# Patient Record
Sex: Female | Born: 1975
Health system: Southern US, Community
[De-identification: ages and names within clinical notes are randomized; demographics above are authoritative.]

## PROBLEM LIST (undated history)

## (undated) DIAGNOSIS — C801 Malignant (primary) neoplasm, unspecified: Secondary | ICD-10-CM

## (undated) DIAGNOSIS — N189 Chronic kidney disease, unspecified: Secondary | ICD-10-CM

## (undated) DIAGNOSIS — N390 Urinary tract infection, site not specified: Secondary | ICD-10-CM

## (undated) DIAGNOSIS — N3010 Interstitial cystitis (chronic) without hematuria: Secondary | ICD-10-CM

## (undated) DIAGNOSIS — N939 Abnormal uterine and vaginal bleeding, unspecified: Principal | ICD-10-CM

## (undated) DIAGNOSIS — R102 Pelvic and perineal pain: Secondary | ICD-10-CM

## (undated) DIAGNOSIS — Z87442 Personal history of urinary calculi: Secondary | ICD-10-CM

## (undated) DIAGNOSIS — M1990 Unspecified osteoarthritis, unspecified site: Secondary | ICD-10-CM

## (undated) DIAGNOSIS — Z9889 Other specified postprocedural states: Secondary | ICD-10-CM

## (undated) DIAGNOSIS — R112 Nausea with vomiting, unspecified: Secondary | ICD-10-CM

## (undated) DIAGNOSIS — R87619 Unspecified abnormal cytological findings in specimens from cervix uteri: Secondary | ICD-10-CM

## (undated) DIAGNOSIS — B009 Herpesviral infection, unspecified: Secondary | ICD-10-CM

## (undated) DIAGNOSIS — K219 Gastro-esophageal reflux disease without esophagitis: Secondary | ICD-10-CM

## (undated) DIAGNOSIS — R319 Hematuria, unspecified: Secondary | ICD-10-CM

## (undated) DIAGNOSIS — B9689 Other specified bacterial agents as the cause of diseases classified elsewhere: Principal | ICD-10-CM

## (undated) DIAGNOSIS — A599 Trichomoniasis, unspecified: Secondary | ICD-10-CM

## (undated) DIAGNOSIS — N760 Acute vaginitis: Principal | ICD-10-CM

## (undated) DIAGNOSIS — R350 Frequency of micturition: Secondary | ICD-10-CM

## (undated) DIAGNOSIS — D242 Benign neoplasm of left breast: Secondary | ICD-10-CM

## (undated) DIAGNOSIS — N898 Other specified noninflammatory disorders of vagina: Principal | ICD-10-CM

## (undated) HISTORY — DX: Other specified bacterial agents as the cause of diseases classified elsewhere: B96.89

## (undated) HISTORY — DX: Acute vaginitis: N76.0

## (undated) HISTORY — DX: Trichomoniasis, unspecified: A59.9

## (undated) HISTORY — DX: Unspecified abnormal cytological findings in specimens from cervix uteri: R87.619

## (undated) HISTORY — DX: Other specified noninflammatory disorders of vagina: N89.8

## (undated) HISTORY — DX: Benign neoplasm of left breast: D24.2

## (undated) HISTORY — DX: Interstitial cystitis (chronic) without hematuria: N30.10

## (undated) HISTORY — DX: Gastro-esophageal reflux disease without esophagitis: K21.9

## (undated) HISTORY — DX: Malignant (primary) neoplasm, unspecified: C80.1

## (undated) HISTORY — DX: Personal history of urinary calculi: Z87.442

## (undated) HISTORY — DX: Frequency of micturition: R35.0

## (undated) HISTORY — DX: Pelvic and perineal pain: R10.2

## (undated) HISTORY — DX: Urinary tract infection, site not specified: N39.0

## (undated) HISTORY — PX: TUBAL LIGATION: SHX77

## (undated) HISTORY — DX: Abnormal uterine and vaginal bleeding, unspecified: N93.9

## (undated) HISTORY — DX: Hematuria, unspecified: R31.9

## (undated) HISTORY — PX: TONSILLECTOMY AND ADENOIDECTOMY: SUR1326

## (undated) HISTORY — PX: CHOLECYSTECTOMY: SHX55

## (undated) HISTORY — DX: Herpesviral infection, unspecified: B00.9

---

## 2003-01-14 ENCOUNTER — Ambulatory Visit (HOSPITAL_COMMUNITY): Admission: RE | Admit: 2003-01-14 | Discharge: 2003-01-14 | Payer: Self-pay | Admitting: *Deleted

## 2003-03-11 ENCOUNTER — Ambulatory Visit (HOSPITAL_COMMUNITY): Admission: RE | Admit: 2003-03-11 | Discharge: 2003-03-11 | Payer: Self-pay | Admitting: Internal Medicine

## 2003-06-03 ENCOUNTER — Inpatient Hospital Stay (HOSPITAL_COMMUNITY): Admission: RE | Admit: 2003-06-03 | Discharge: 2003-06-05 | Payer: Self-pay | Admitting: *Deleted

## 2004-08-05 ENCOUNTER — Ambulatory Visit (HOSPITAL_COMMUNITY): Admission: RE | Admit: 2004-08-05 | Discharge: 2004-08-05 | Payer: Self-pay | Admitting: Pulmonary Disease

## 2005-03-22 ENCOUNTER — Emergency Department (HOSPITAL_COMMUNITY): Admission: EM | Admit: 2005-03-22 | Discharge: 2005-03-22 | Payer: Self-pay | Admitting: Emergency Medicine

## 2005-06-28 ENCOUNTER — Encounter (INDEPENDENT_AMBULATORY_CARE_PROVIDER_SITE_OTHER): Payer: Self-pay | Admitting: Internal Medicine

## 2005-06-28 ENCOUNTER — Ambulatory Visit: Payer: Self-pay | Admitting: Family Medicine

## 2005-07-12 ENCOUNTER — Ambulatory Visit: Payer: Self-pay | Admitting: Family Medicine

## 2005-08-03 ENCOUNTER — Ambulatory Visit: Payer: Self-pay | Admitting: Family Medicine

## 2005-08-05 ENCOUNTER — Ambulatory Visit: Payer: Self-pay | Admitting: Family Medicine

## 2005-08-31 ENCOUNTER — Ambulatory Visit: Payer: Self-pay | Admitting: Family Medicine

## 2005-08-31 ENCOUNTER — Encounter (INDEPENDENT_AMBULATORY_CARE_PROVIDER_SITE_OTHER): Payer: Self-pay | Admitting: Internal Medicine

## 2005-09-01 ENCOUNTER — Ambulatory Visit (HOSPITAL_COMMUNITY): Admission: RE | Admit: 2005-09-01 | Discharge: 2005-09-01 | Payer: Self-pay | Admitting: Family Medicine

## 2005-09-01 ENCOUNTER — Encounter (INDEPENDENT_AMBULATORY_CARE_PROVIDER_SITE_OTHER): Payer: Self-pay | Admitting: Internal Medicine

## 2005-10-25 ENCOUNTER — Encounter (INDEPENDENT_AMBULATORY_CARE_PROVIDER_SITE_OTHER): Payer: Self-pay | Admitting: Internal Medicine

## 2006-02-27 ENCOUNTER — Encounter: Payer: Self-pay | Admitting: Family Medicine

## 2006-02-27 DIAGNOSIS — K219 Gastro-esophageal reflux disease without esophagitis: Secondary | ICD-10-CM | POA: Insufficient documentation

## 2006-02-27 DIAGNOSIS — K5900 Constipation, unspecified: Secondary | ICD-10-CM | POA: Insufficient documentation

## 2006-02-27 DIAGNOSIS — L989 Disorder of the skin and subcutaneous tissue, unspecified: Secondary | ICD-10-CM | POA: Insufficient documentation

## 2006-04-13 ENCOUNTER — Telehealth (INDEPENDENT_AMBULATORY_CARE_PROVIDER_SITE_OTHER): Payer: Self-pay | Admitting: Family Medicine

## 2006-11-24 ENCOUNTER — Emergency Department (HOSPITAL_COMMUNITY): Admission: EM | Admit: 2006-11-24 | Discharge: 2006-11-25 | Payer: Self-pay | Admitting: *Deleted

## 2007-02-01 ENCOUNTER — Ambulatory Visit: Payer: Self-pay | Admitting: Orthopedic Surgery

## 2007-02-01 DIAGNOSIS — M5430 Sciatica, unspecified side: Secondary | ICD-10-CM | POA: Insufficient documentation

## 2007-03-13 ENCOUNTER — Telehealth: Payer: Self-pay | Admitting: Orthopedic Surgery

## 2007-03-14 ENCOUNTER — Encounter: Payer: Self-pay | Admitting: Orthopedic Surgery

## 2007-04-16 ENCOUNTER — Telehealth: Payer: Self-pay | Admitting: Orthopedic Surgery

## 2007-04-26 ENCOUNTER — Telehealth: Payer: Self-pay | Admitting: Orthopedic Surgery

## 2007-06-20 ENCOUNTER — Ambulatory Visit: Payer: Self-pay | Admitting: Internal Medicine

## 2007-06-20 LAB — CONVERTED CEMR LAB
Inflenza A Ag: NEGATIVE
Rapid Strep: NEGATIVE

## 2007-06-22 ENCOUNTER — Telehealth (INDEPENDENT_AMBULATORY_CARE_PROVIDER_SITE_OTHER): Payer: Self-pay | Admitting: Internal Medicine

## 2007-07-13 ENCOUNTER — Ambulatory Visit (HOSPITAL_COMMUNITY): Admission: RE | Admit: 2007-07-13 | Discharge: 2007-07-13 | Payer: Self-pay | Admitting: Occupational Medicine

## 2007-10-10 ENCOUNTER — Ambulatory Visit: Payer: Self-pay | Admitting: Internal Medicine

## 2007-10-10 DIAGNOSIS — J209 Acute bronchitis, unspecified: Secondary | ICD-10-CM | POA: Insufficient documentation

## 2007-10-15 ENCOUNTER — Telehealth (INDEPENDENT_AMBULATORY_CARE_PROVIDER_SITE_OTHER): Payer: Self-pay | Admitting: Internal Medicine

## 2007-11-22 ENCOUNTER — Other Ambulatory Visit: Admission: RE | Admit: 2007-11-22 | Discharge: 2007-11-22 | Payer: Self-pay | Admitting: Obstetrics and Gynecology

## 2008-02-08 ENCOUNTER — Ambulatory Visit: Payer: Self-pay | Admitting: Internal Medicine

## 2008-03-14 ENCOUNTER — Encounter (INDEPENDENT_AMBULATORY_CARE_PROVIDER_SITE_OTHER): Payer: Self-pay | Admitting: Internal Medicine

## 2008-05-30 ENCOUNTER — Ambulatory Visit: Payer: Self-pay | Admitting: Internal Medicine

## 2008-05-30 DIAGNOSIS — R300 Dysuria: Secondary | ICD-10-CM | POA: Insufficient documentation

## 2008-05-30 DIAGNOSIS — B373 Candidiasis of vulva and vagina: Secondary | ICD-10-CM | POA: Insufficient documentation

## 2008-05-30 LAB — CONVERTED CEMR LAB
Bilirubin Urine: NEGATIVE
Blood Glucose, AC Bkfst: 105 mg/dL
Glucose, Urine, Semiquant: NEGATIVE
Ketones, urine, test strip: NEGATIVE
Nitrite: NEGATIVE
Specific Gravity, Urine: 1.02
Urobilinogen, UA: 0.2
pH: 7

## 2008-06-03 LAB — CONVERTED CEMR LAB
BUN: 11 mg/dL (ref 6–23)
CO2: 22 meq/L (ref 19–32)
Calcium: 9.1 mg/dL (ref 8.4–10.5)
Chloride: 108 meq/L (ref 96–112)
Cholesterol: 166 mg/dL (ref 0–200)
Creatinine, Ser: 0.68 mg/dL (ref 0.40–1.20)
Glucose, Bld: 92 mg/dL (ref 70–99)
HDL: 56 mg/dL (ref >39)
LDL Cholesterol: 96 mg/dL (ref 0–99)
Potassium: 4.4 meq/L (ref 3.5–5.3)
Sodium: 140 meq/L (ref 135–145)
Total CHOL/HDL Ratio: 3
Triglycerides: 72 mg/dL (ref <150)
VLDL: 14 mg/dL (ref 0–40)

## 2008-07-09 ENCOUNTER — Ambulatory Visit: Payer: Self-pay | Admitting: Internal Medicine

## 2008-07-09 DIAGNOSIS — L255 Unspecified contact dermatitis due to plants, except food: Secondary | ICD-10-CM | POA: Insufficient documentation

## 2008-10-08 ENCOUNTER — Other Ambulatory Visit: Admission: RE | Admit: 2008-10-08 | Discharge: 2008-10-08 | Payer: Self-pay | Admitting: Obstetrics and Gynecology

## 2009-04-09 ENCOUNTER — Inpatient Hospital Stay (HOSPITAL_COMMUNITY): Admission: RE | Admit: 2009-04-09 | Discharge: 2009-04-12 | Payer: Self-pay | Admitting: Obstetrics and Gynecology

## 2009-04-13 ENCOUNTER — Encounter: Admission: RE | Admit: 2009-04-13 | Discharge: 2009-04-25 | Payer: Self-pay | Admitting: Obstetrics and Gynecology

## 2010-04-19 LAB — CBC
HCT: 31.6 % — ABNORMAL LOW (ref 36.0–46.0)
HCT: 38.8 % (ref 36.0–46.0)
Hemoglobin: 10.8 g/dL — ABNORMAL LOW (ref 12.0–15.0)
Hemoglobin: 12.9 g/dL (ref 12.0–15.0)
MCHC: 33.3 g/dL (ref 30.0–36.0)
MCHC: 34.2 g/dL (ref 30.0–36.0)
MCV: 93.1 fL (ref 78.0–100.0)
MCV: 93.2 fL (ref 78.0–100.0)
Platelets: 161 10*3/uL (ref 150–400)
Platelets: 182 10*3/uL (ref 150–400)
RBC: 3.39 MIL/uL — ABNORMAL LOW (ref 3.87–5.11)
RBC: 4.17 MIL/uL (ref 3.87–5.11)
RDW: 14.2 % (ref 11.5–15.5)
RDW: 14.2 % (ref 11.5–15.5)
WBC: 10.1 10*3/uL (ref 4.0–10.5)
WBC: 13.2 10*3/uL — ABNORMAL HIGH (ref 4.0–10.5)

## 2010-04-19 LAB — BASIC METABOLIC PANEL
BUN: 4 mg/dL — ABNORMAL LOW (ref 6–23)
CO2: 23 mEq/L (ref 19–32)
Calcium: 9 mg/dL (ref 8.4–10.5)
Chloride: 106 mEq/L (ref 96–112)
Creatinine, Ser: 0.42 mg/dL (ref 0.4–1.2)
GFR calc Af Amer: >60 mL/min (ref >60)
GFR calc non Af Amer: >60 mL/min (ref >60)
Glucose, Bld: 88 mg/dL (ref 70–99)
Potassium: 3.7 mEq/L (ref 3.5–5.1)
Sodium: 135 mEq/L (ref 135–145)

## 2010-04-19 LAB — TYPE AND SCREEN
ABO/RH(D): A POS
Antibody Screen: NEGATIVE

## 2010-04-19 LAB — HEMOGLOBIN A1C
Hgb A1c MFr Bld: 4.9 % (ref 4.6–6.1)
Mean Plasma Glucose: 94 mg/dL

## 2010-04-19 LAB — GLUCOSE, CAPILLARY
Glucose-Capillary: 102 mg/dL — ABNORMAL HIGH (ref 70–99)
Glucose-Capillary: 81 mg/dL (ref 70–99)
Glucose-Capillary: 92 mg/dL (ref 70–99)

## 2010-04-19 LAB — ABO/RH: ABO/RH(D): A POS

## 2010-04-19 LAB — RPR: RPR Ser Ql: NONREACTIVE

## 2010-06-11 NOTE — H&P (Signed)
NAME:  Marissa Trevino, MASCI                           ACCOUNT NO.:  [id number]   MEDICAL RECORD NO.:  [id number]                   PATIENT TYPE:  AMB   LOCATION:  DAY                                  FACILITY:  [facility]   PHYSICIAN:  Langley Gauss, M.D.                DATE OF BIRTH:  07-04-1975   DATE OF ADMISSION:  06/03/2003  DATE OF DISCHARGE:                                HISTORY & PHYSICAL   The patient is admitted for repeat low transverse Cesarean section. The  surgical procedure is scheduled at 7:30 a.m.   The patient is a 35 year old, gravida 2, para 1, at 38+ weeks gestation by  good dating criteria who is elective repeat Cesarean section. The patient's  prenatal course is complicated by findings of greater than 10 to the 5th  group B strep bacteruria on January 03, 2003. She was treated with  amoxicillin at that time. However, on retesting for carrier status on May 15, 2003, the patient was noted to test negative. The patient has had serial  ultrasounds which have documented normal anatomic survey and normal fetal  growth. She was noted to have normal glucose tolerance test during her  previous pregnancy. She has no high risk factors, thus is not repeated  during this pregnancy. She is noted to be A positive blood type. The AFP  triple screen is within normal limits. GC and Chlamydia cultures are  negative. Urine drug screen is also noted to be negative.   PAST MEDICAL HISTORY:  On July 18, 1995, the patient had a primary low  transverse Cesarean section performed on findings of breech presentation,  thus patient will proceed with elective repeat during this gestation.  She  underwent laparoscopic cholecystectomy in 1998. She states she is allergic  to CODEINE, bu does well with oxycodone and Hydrocodone.   SOCIAL HISTORY:  The patient is a nonsmoker. The father of this baby is  name, Fara Boros, who is employed as a Curator in [place].  The patient  would like to try  breast feeding. She will be utilizing pediatrician on call  and then [facility]. She would like to utilize birth control pills for postpartum  birth control options.  Currently, Dr. Danne Harbor and Dr. Dietrich Pates office was  notified, dated May 02, 2003, regarding the planned repeat Cesarean section  for Jun 03, 2003, 07:30 a.m., specifically Dois Davenport called our office and  spoke with Irving Burton.   PHYSICAL EXAMINATION:  GENERAL: A pleasant white female.  VITAL SIGNS: Height 5 feet 3 inches, prepregnancy weight 144 and most recent  217, blood pressure 117/78, pulse rate 80, respiratory rate 20.  HEENT: Negative, no adenopathy.  NECK: Supple. Thyroid not palpable.  LUNGS: Clear.  CARDIOVASCULAR: Regular rate and rhythm.  ABDOMEN: Soft and nontender. Vertex presentation by Leopold's maneuver.  Fetal heart tones auscultated in the 150s. Low transverse uterine incision  is noted.  EXTREMITIES:  Within normal limits.  PELVIC EXAM: Normal external genitalia. No lesions or ulcerations  identified. No vaginal bleeding and no leakage of fluid.   ASSESSMENT:  A 38+ week intrauterine pregnancy by good dating criteria. The  patient is admitted for repeat low transverse Cesarean section on the stated  date of service.     ___________________________________________                                         Langley Gauss, M.D.   DC/MEDQ  D:  06/02/2003  T:  06/03/2003  Job:  [id number]

## 2010-06-11 NOTE — Op Note (Signed)
NAME:  Marissa Trevino, Marissa Trevino                           ACCOUNT NO.:  [id number]   MEDICAL RECORD NO.:  [id number]                   PATIENT TYPE:  INP   LOCATION:  A402                                 FACILITY:  [facility]   PHYSICIAN:  Langley Gauss, M.D.                DATE OF BIRTH:  August 08, 1975   DATE OF PROCEDURE:  06/03/2003  DATE OF DISCHARGE:                                 OPERATIVE REPORT   PREOPERATIVE DIAGNOSES:  1. Thirty-eight-week-plus intrauterine pregnancy.  2. previous low transverse cesarean section performed a breech presentation.   OPERATION/PROCEDURE:  Repeat low vertical cesarean section, delivery of a 9  pound 11 ounce, macrosomic female infant, through a Pfannenstiel skin  incision and low transverse uterine incision.   SURGEON:  Langley Gauss, M.D.   ESTIMATED BLOOD LOSS:  800 mL.   SPECIMENS:  1. Arterial cord gas and cord blood to laboratory.  2. Placenta likewise is sent to pathology laboratory as there is noted to be     a true knot in the umbilical cord.   PEDIATRICIAN:  __________.   DRAINS:  Jackson-Pratt catheter is placed within the subcutaneous space.  In  additional a Foley catheter is draining clear yellow urine from the bladder.   DESCRIPTION OF PROCEDURE:  The patient had planned a repeat cesarean section  on Jun 03, 2003.  The patient presented early this a.m. at which time a non-  stress test was performed on the fourth floor obstetrical unit.  The patient  is then taken to the operating room where her vital signs are stable.  The  patient had a spinal analgesic administered by the OR staff.  She is then  placed on the OR stable with a slight left lateral tilt, prepped and draped  in the usual sterile manner.  Foley catheter sterilely placed by the OR  staff.  After assurance that adequate surgical analgesia, a knife is used to  incise a Pfannenstiel incision at the previous Pfannenstiel site, dissected  down to the fascia utilizing the knife and  cauterizing bleeders along the  way.  The fascia is then incised in a transverse curvilinear manner.  Utilizing sharp dissection with a knife, followed by transection of the  fascia, utilizing the Mayo scissors while bluntly separated off the  underlying rectus muscle.  The fascia edges is grasped with straight Kocher  clamps, fascia is separated off the underlying rectus muscle both superiorly  and inferiorly in the midline utilizing sharp dissection with the Mayo  scissors.  The rectus muscles are stitched together in the middle.  A knife  was used to incise down through this rectus muscle site until the peritoneum  is encountered.  The rectus muscles are separated in the midline utilizing  sharp dissection with the Mayo scissors.  Peritoneum is identified.  Peritoneum is elevated with hemostats.  Mayo scissors used to atraumatically  enter the peritoneal cavity.  Peritoneal incision is then extended  superiorly and inferiorly.  Inferiorly we directly visualized and palpated  the bladder to avoid any accidental injury.  A bladder blade is then placed.  Peritoneal incision extended superiorly up to the level near the umbilicus.  A bladder flap is then created from vesicouterine fold at the left  transverse uterine segment.  This is then bluntly separated off the anterior  low transverse uterine segment.  A sharp knife is then used to score a low  transverse uterine incision.  Intact amniotic sac is encountered in the  midline.  My index fingers are used to bluntly extend the uterine incision  bilaterally.  Intact amniotic sac is then ruptured with an Allis clamp.  Clear amniotic fluid is noted.  My index fingers are then used to bluntly  extend the low transverse uterine incision.  The head of the infant is then  flexed and elevated.  The disposable suction connected to the wall suction  is then applied to the infant's vertex.  Gentle traction and suction is then  applied.  With gentle  traction and fundal pressure, the infant's vertex  delivered very easily through the low transverse uterine incision.  Mouth  and nares are bulb-suctioned of clear amniotic fluid.  Suction cup is then  removed.  Again fundal pressure plus gentle traction results in delivery of  the remainder of the infant with the right shoulder in an anterior position.  Delivery itself is performed without complications.  A spontaneous cry is  noted. The umbilical cord is milked towards the infant.  Cord is doubly  clamped and cut and infant is taken to the waiting pediatrician, Dr.  __________.  The true knot in the umbilical cord is noted.  It is noted to  have had no significance during the pregnancy course.  Arterial cord gas and  cord blood are then obtained.  Gentle traction combined with intrauterine  exploration results in removal of what appears to be an intact placenta.  The uterus is then exteriorized.  The uterine excision is noted to have not  extended.  Intrauterine exploration reveals no retained placenta fragments.  The uterine incision is then closed in two layers of 0 chromic in a running  locked fascia, the second layer being an imbricating layer.  There was small  hematoma just subserosally near the left apex of the incision which requires  several figure-of-eight sutures to control and completely obliterate.  The  bladder flap is reapproximated utilizing continuous running 0 chromic  suture.  This assures hemostasis.  Tubes and ovaries are noted to be normal  in appearance as is the uterus itself.  Cul-de-sac is then irrigated free of  all clots.  Uterus returned to the pelvic cavity.  Sponge, needle and  instrument counts are correct x2 at this point. Noted at this time is some  small amount of active bleeding occurring at the peritoneal edge of the  bladder flap near the left apex of the incision.  This is then controlled  utilizing a figure-of-eight suture of 0 chromic to affect  hemostasis. Sponge, needle and instrument counts are correct x2 again at this point.  Peritoneal edges are grasped using Kelly clamps.  Copious irrigation is then  performed until clear.  The peritoneum is then closed with a continuous  running 0 chromic suture.  This likewise reapproximates the overlying rectus  muscle in the midline.  No bleeders are noted associated with the rectus  muscle.  Thus, the  fascia is then closed with a 0 PDS suture continuous  running suture.  Subcutaneous bleeders are then cauterized.  A Jackson-Pratt  drain is placed in the subcutaneous space with a separate exit wound to the  right apex of the incision.  Jackson-Pratt drain is sutured into place.  Three horizontal mattress sutures of 0 PDS are then placed to function as  retention-type sutures to facilitate our skin stapling.  The entirety of the  skin incisions are closed utilizing skin staples.  A total of 30 mL 0.5%  bupivacaine plain is then injected along the entirety of the skin incision  to raise small skin wheals.  The patient tolerated the procedure very well.  She continues to drain clear yellow urine.  Vital signs remain stable.  She  is thus taken to the recovery room in stable condition.  Will now go to the  fourth floor and discuss operative findings with the patient's waiting  family.      ___________________________________________                                            Langley Gauss, M.D.   DC/MEDQ  D:  06/03/2003  T:  06/03/2003  Job:  [id number]

## 2010-06-11 NOTE — Discharge Summary (Signed)
NAME:  Marissa Trevino, Marissa Trevino                           ACCOUNT NO.:  [id number]   MEDICAL RECORD NO.:  [id number]                   PATIENT TYPE:  INP   LOCATION:  A402                                 FACILITY:  [facility]   PHYSICIAN:  Langley Gauss, M.D.                DATE OF BIRTH:  12/03/1975   DATE OF ADMISSION:  06/03/2003  DATE OF DISCHARGE:  06/05/2003                                 DISCHARGE SUMMARY   DIAGNOSES:  1. 39-week intrauterine pregnancy, previous cesarean section.  2. Surgery performed Jun 03, 2003; repeat low transverse cesarean section.   DISPOSITION:  At the time of discharge staples are left in place in the  Pfannenstiel incision.  She is advised to call or contact the office for  staple removal in five days time.  She is given a copy of standardized  discharge instructions.   DISCHARGE MEDICATIONS:  She is given prescription for Tylox #30 with no  refill.   The patient is both bottle and breast feeding at time of discharge.   PERTINENT LABORATORY STUDIES:  Admission hemoglobin hematocrit 12.1/35.8  with white count 8.2.  Post partum day #1 hemoglobin 9.8, hematocrit 29.5  with white count of 10.8.  This represents anemia secondary to blood loss  associated with delivery.  The patient at time of visit would likely be  interested in utilizing oral contraceptives for birth control purposes.   HOSPITAL COURSE:  See previous dictation.  See operative procedure performed  on Jun 03, 2003 as a scheduled repeat cesarean section.  Spinal analgesic  administered by anesthesia staff and worked very well.  Postoperatively, the  patient did well.  She had no postoperative complications.  After removal of  the Foley catheter on postoperative day #1, she was ambulatory and able to  void without difficulty.  She remained afebrile.  Advanced her diet on  postoperative day #2.  The patient is doing very well with p.o. Tylox for  pain relief.  She is tolerating regular general diet.   Has begun passing  flatus, but bowel movement has not yet occurred.   On examination, abdomen is noted to be soft and nondistended.  The incision  is well approximated.     ___________________________________________                                         Langley Gauss, M.D.   DC/MEDQ  D:  06/05/2003  T:  06/05/2003  Job:  [id number]

## 2010-11-03 LAB — URINALYSIS, ROUTINE W REFLEX MICROSCOPIC
Glucose, UA: NEGATIVE
Leukocytes, UA: NEGATIVE
Nitrite: NEGATIVE
Specific Gravity, Urine: >1.03 — ABNORMAL HIGH
Urobilinogen, UA: 0.2
pH: 5.5

## 2010-11-03 LAB — BASIC METABOLIC PANEL
BUN: 11
CO2: 25
Calcium: 9.1
Chloride: 104
Creatinine, Ser: 0.49
GFR calc Af Amer: >60
GFR calc non Af Amer: >60
Glucose, Bld: 124 — ABNORMAL HIGH
Potassium: 3.9
Sodium: 135

## 2010-11-03 LAB — URINE MICROSCOPIC-ADD ON

## 2010-11-03 LAB — PREGNANCY, URINE: Preg Test, Ur: NEGATIVE

## 2010-12-14 ENCOUNTER — Other Ambulatory Visit (HOSPITAL_COMMUNITY)
Admission: RE | Admit: 2010-12-14 | Discharge: 2010-12-14 | Disposition: A | Discharge status: Home / Self Care | Payer: Self-pay | Source: Ambulatory Visit | Attending: Obstetrics and Gynecology | Admitting: Obstetrics and Gynecology

## 2010-12-14 DIAGNOSIS — Z01419 Encounter for gynecological examination (general) (routine) without abnormal findings: Secondary | ICD-10-CM | POA: Insufficient documentation

## 2011-02-18 ENCOUNTER — Emergency Department (HOSPITAL_COMMUNITY)
Admission: EM | Admit: 2011-02-18 | Discharge: 2011-02-18 | Disposition: A | Discharge status: Home / Self Care | Payer: 59 | Source: Home / Self Care | Attending: Emergency Medicine | Admitting: Emergency Medicine

## 2011-02-18 ENCOUNTER — Encounter (HOSPITAL_COMMUNITY): Payer: Self-pay

## 2011-02-18 DIAGNOSIS — B9789 Other viral agents as the cause of diseases classified elsewhere: Secondary | ICD-10-CM

## 2011-02-18 DIAGNOSIS — J029 Acute pharyngitis, unspecified: Secondary | ICD-10-CM

## 2011-02-18 DIAGNOSIS — B349 Viral infection, unspecified: Secondary | ICD-10-CM

## 2011-02-18 LAB — POCT RAPID STREP A: Streptococcus, Group A Screen (Direct): NEGATIVE

## 2011-02-18 MED ORDER — HYDROCODONE-ACETAMINOPHEN 5-325 MG PO TABS: 2.0000 | ORAL_TABLET | ORAL | Status: AC | PRN

## 2011-02-18 MED ORDER — LIDOCAINE VISCOUS 2 % MT SOLN: 10.0000 mL | Freq: Three times a day (TID) | OROMUCOSAL | Status: AC | PRN

## 2011-02-18 MED ORDER — IBUPROFEN 600 MG PO TABS: 600.0000 mg | ORAL_TABLET | Freq: Four times a day (QID) | ORAL | Status: AC | PRN

## 2011-02-18 NOTE — ED Provider Notes (Signed)
History     CSN: [id number]  Arrival date & time 02/18/11  1103   First MD Initiated Contact with Patient 02/18/11 1155      Chief Complaint  Patient presents with  . Sore Throat    (Consider location/radiation/quality/duration/timing/severity/associated sxs/prior treatment) HPI Comments: Patient with sore throat, body aches, fatigue over the past 4 or 5 days. Reports nonproductive cough and rhinorrhea.  States she had a fever to approximately 103 last night. Was unable to sleep secondary to coughing and body aches. Has been taking DayQuil, NyQuil, Tylenol 1 g with moderate relief. No sinus pain/pressure, nausea, vomiting, wheezing, chest pain, shortness of breath, urinary complaints. Patient works in a nursing home, and her children also have a viral illness  ROS as noted in HPI. All other ROS negative. .  Patient is a 36 y.o. female presenting with pharyngitis. The history is provided by the patient.  Sore Throat This is a new problem. The current episode started more than 2 days ago. The problem occurs constantly.    History reviewed. No pertinent past medical history.  Past Surgical History  Procedure Date  . Tubal ligation   . Cesarean section   . Tonsillectomy and adenoidectomy     History reviewed. No pertinent family history.  History  Substance Use Topics  . Smoking status: Never Smoker   . Smokeless tobacco: Not on file  . Alcohol Use: No    OB History    Grav Para Term Preterm Abortions TAB SAB Ect Mult Living                  Review of Systems  Allergies  Codeine  Home Medications   Current Outpatient Rx  Name Route Sig Dispense Refill  . ACETAMINOPHEN 500 MG PO TABS Oral Take 500 mg by mouth every 6 (six) hours as needed.    Marland Kitchen HYDROCODONE-ACETAMINOPHEN 5-325 MG PO TABS Oral Take 2 tablets by mouth every 4 (four) hours as needed for pain. 20 tablet 0  . IBUPROFEN 600 MG PO TABS Oral Take 1 tablet (600 mg total) by mouth every 6 (six) hours as  needed for pain. 30 tablet 0  . LIDOCAINE VISCOUS 2 % MT SOLN Oral Take 10 mLs by mouth 3 (three) times daily as needed for pain. Swish and spit. Do not swallow. 100 mL 0    BP 120/68  Pulse 118  Temp(Src) 100.4 F (38 C) (Oral)  Resp 16  SpO2 100%  LMP 01/28/2011  Physical Exam  Nursing note and vitals reviewed. Constitutional: She is oriented to person, place, and time. She appears well-developed and well-nourished.  HENT:  Head: Normocephalic and atraumatic. No trismus in the jaw.  Right Ear: Tympanic membrane and ear canal normal.  Left Ear: Tympanic membrane and ear canal normal.  Nose: Mucosal edema and rhinorrhea present. No epistaxis.  Mouth/Throat: Uvula is midline and mucous membranes are normal. No uvula swelling. Posterior oropharyngeal erythema present. No oropharyngeal exudate.       (-) frontal, maxillary sinus tenderness. Tonsils surgically absent. Ulcers on hard palate.  Eyes: Conjunctivae and EOM are normal.  Neck: Normal range of motion. Neck supple.  Cardiovascular: Regular rhythm, normal heart sounds and intact distal pulses.   Pulmonary/Chest: Effort normal and breath sounds normal.  Abdominal: Soft. She exhibits no distension. There is no splenomegaly. There is no tenderness. There is no rebound, no guarding and no CVA tenderness.  Musculoskeletal: Normal range of motion.  Lymphadenopathy:    She has  cervical adenopathy.  Neurological: She is alert and oriented to person, place, and time.  Skin: Skin is warm and dry. No rash noted.  Psychiatric: She has a normal mood and affect. Her behavior is normal. Judgment and thought content normal.    ED Course  Procedures (including critical care time)   Labs Reviewed  POCT RAPID STREP A ([facility] ONLY)   No results found.   1. Pharyngitis with viral syndrome    Results for orders placed during the hospital encounter of 02/18/11  POCT RAPID STREP A ([facility] ONLY)      Component Value Range    Streptococcus, Group A Screen (Direct) NEGATIVE  NEGATIVE       MDM  Patient appears well-hydrated. Exam is consistent with coxsackie infection. We'll send her home with supportive treatment, and work note for 2 days  Luiz Blare, MD 02/18/11 1342

## 2011-02-18 NOTE — ED Notes (Signed)
C/o ST, fever for past 4-5 days; minmal relief w OTC medications

## 2012-03-06 ENCOUNTER — Encounter (HOSPITAL_COMMUNITY): Payer: Self-pay | Admitting: Pharmacy Technician

## 2012-03-06 NOTE — Patient Instructions (Signed)
Marissa Trevino  03/06/2012   Your procedure is scheduled on:  03/09/12  [facility] to [facility] [place] AM.  Call this number if you have problems the morning of surgery: [phone]   Remember:   Do not eat food or drink liquids after midnight.   Take these medicines the morning of surgery with A SIP OF WATER:none   Do not wear jewelry, make-up or nail polish.  Do not wear lotions, powders, or perfumes. You may wear deodorant.  Do not shave 48 hours prior to surgery. Men may shave face and neck.  Do not bring valuables to the hospital.  Contacts, dentures or bridgework may not be worn into surgery.  Leave suitcase in the car. After surgery it may be brought to your room.  For patients admitted to the hospital, checkout time is 11:00 AM the day of  discharge.   Patients discharged the day of surgery will not be allowed to drive  home.  Name and phone number of your driver: family  Special Instructions: Shower using CHG 2 nights before surgery and the night before surgery.  If you shower the day of surgery use CHG.  Use special wash - you have one bottle of CHG for all showers.  You should use approximately 1/3 of the bottle for each shower.   Please read over the following fact sheets that you were given: MRSA Information, Surgical Site Infection Prevention, Anesthesia Post-op Instructions and Care and Recovery After Surgery   PATIENT INSTRUCTIONS POST-ANESTHESIA  IMMEDIATELY FOLLOWING SURGERY:  Do not drive or operate machinery for the first twenty four hours after surgery.  Do not make any important decisions for twenty four hours after surgery or while taking narcotic pain medications or sedatives.  If you develop intractable nausea and vomiting or a severe headache please notify your doctor immediately.  FOLLOW-UP:  Please make an appointment with your surgeon as instructed. You do not need to follow up with anesthesia unless specifically instructed to do so.  WOUND CARE INSTRUCTIONS (if  applicable):  Keep a dry clean dressing on the anesthesia/puncture wound site if there is drainage.  Once the wound has quit draining you may leave it open to air.  Generally you should leave the bandage intact for twenty four hours unless there is drainage.  If the epidural site drains for more than 36-48 hours please call the anesthesia department.  QUESTIONS?:  Please feel free to call your physician or the hospital operator if you have any questions, and they will be happy to assist you.      Dilation and Curettage or Vacuum Curettage Dilation and curettage (D&C) and vacuum curettage are minor procedures. A D&C involves stretching (dilation) the cervix and scraping (curettage) the inside lining of the womb (uterus). During a D&C, tissue is gently scraped from the inside lining of the uterus. During a vacuum curettage, the lining and tissue in the uterus are removed with the use of gentle suction. Curettage may be performed for diagnostic or therapeutic purposes. As a diagnostic procedure, curettage is performed for the purpose of examining tissues from the uterus. Tissue examination may help determine causes or treatment options for symptoms. A diagnostic curettage may be performed for the following symptoms:  Irregular bleeding in the uterus.  Bleeding with the development of clots.  Spotting between menstrual periods.  Prolonged menstrual periods.  Bleeding after menopause.  No menstrual period (amenorrhea).  A change in size and shape of the uterus. A therapeutic curettage  is performed to remove tissue, blood, or a contraceptive device. Therapeutic curettage may be performed for the following conditions:   Removal of an IUD (intrauterine device).  Removal of retained placenta after giving birth. Retained placenta can cause bleeding severe enough to require transfusions or an infection.  Abortion.  Miscarriage.  Removal of polyps inside the uterus.  Removal of uncommon types  of fibroids (noncancerous lumps). LET YOUR CAREGIVER KNOW ABOUT:   Allergies to food or medicine.  Medicines taken, including vitamins, herbs, eyedrops, over-the-counter medicines, and creams.  Use of steroids (by mouth or creams).  Previous problems with anesthetics or numbing medicines.  History of bleeding problems or blood clots.  Previous surgery.  Other health problems, including diabetes and kidney problems.  Possibility of pregnancy, if this applies. RISKS AND COMPLICATIONS   Excessive bleeding.  Infection of the uterus.  Damage to the cervix.  Development of scar tissue (adhesions) inside the uterus, later causing abnormal amounts of menstrual bleeding.  Complications from the general anesthetic, if a general anesthetic is used.  Putting a hole (perforation) in the uterus. This is rare. BEFORE THE PROCEDURE   Eat and drink before the procedure only as directed by your caregiver.  Arrange for someone to take you home. PROCEDURE   This procedure may be done in a hospital, outpatient clinic, or caregiver's office.  You may be given a general anesthetic or a local anesthetic in and around the cervix.  You will lie on your back with your legs in stirrups.  There are two ways in which your cervix can be softened and dilated. These include:  Taking a medicine.  Having thin rods (laminaria) inserted into your cervix.  A curved tool (curette) will scrape cells from the inside lining of the uterus and will then be removed. This procedure usually takes about 15 to 30 minutes. AFTER THE PROCEDURE   You will rest in the recovery area until you are stable and are ready to go home.  You will need to have someone take you home.  You may feel sick to your stomach (nauseous) or throw up (vomit) if you had general anesthesia.  You may have a sore throat if a tube was placed in your throat during general anesthesia.  You may have light cramping and bleeding for 2  days to 2 weeks after the procedure.  Your uterus needs to make a new lining after the procedure. This may make your next period late. Document Released: 01/10/2005 Document Revised: 04/04/2011 Document Reviewed: 08/08/2008 [facility] Patient Information 2013 [place], [place].

## 2012-03-07 ENCOUNTER — Other Ambulatory Visit: Payer: Self-pay | Admitting: Obstetrics & Gynecology

## 2012-03-07 ENCOUNTER — Encounter (HOSPITAL_COMMUNITY): Payer: Self-pay

## 2012-03-07 ENCOUNTER — Encounter (HOSPITAL_COMMUNITY)
Admission: RE | Admit: 2012-03-07 | Discharge: 2012-03-07 | Disposition: A | Discharge status: Home / Self Care | Payer: 59 | Source: Ambulatory Visit | Attending: Obstetrics & Gynecology | Admitting: Obstetrics & Gynecology

## 2012-03-07 HISTORY — DX: Other specified postprocedural states: Z98.890

## 2012-03-07 HISTORY — DX: Nausea with vomiting, unspecified: R11.2

## 2012-03-07 LAB — URINALYSIS, ROUTINE W REFLEX MICROSCOPIC
Glucose, UA: NEGATIVE mg/dL
Ketones, ur: NEGATIVE mg/dL
Leukocytes, UA: NEGATIVE
Protein, ur: NEGATIVE mg/dL

## 2012-03-07 LAB — COMPREHENSIVE METABOLIC PANEL
AST: 13 U/L (ref 0–37)
CO2: 24 mEq/L (ref 19–32)
Chloride: 106 mEq/L (ref 96–112)
Creatinine, Ser: 0.66 mg/dL (ref 0.50–1.10)
GFR calc non Af Amer: >90 mL/min (ref >90)
Glucose, Bld: 91 mg/dL (ref 70–99)
Total Bilirubin: 0.1 mg/dL — ABNORMAL LOW (ref 0.3–1.2)

## 2012-03-07 LAB — CBC
HCT: 34.7 % — ABNORMAL LOW (ref 36.0–46.0)
Hemoglobin: 11.6 g/dL — ABNORMAL LOW (ref 12.0–15.0)
MCV: 81.6 fL (ref 78.0–100.0)
Platelets: 276 10*3/uL (ref 150–400)
RBC: 4.25 MIL/uL (ref 3.87–5.11)
WBC: 6.9 10*3/uL (ref 4.0–10.5)

## 2012-03-07 LAB — URINE MICROSCOPIC-ADD ON

## 2012-03-08 MED ORDER — MUPIROCIN 2 % EX OINT
TOPICAL_OINTMENT | CUTANEOUS | Status: AC
  Filled 2012-03-08: qty 22

## 2012-03-09 ENCOUNTER — Ambulatory Visit (HOSPITAL_COMMUNITY): Payer: 59 | Admitting: Anesthesiology

## 2012-03-09 ENCOUNTER — Ambulatory Visit (HOSPITAL_COMMUNITY)
Admission: RE | Admit: 2012-03-09 | Discharge: 2012-03-09 | Disposition: A | Discharge status: Home / Self Care | Payer: 59 | Source: Ambulatory Visit | Attending: Obstetrics & Gynecology | Admitting: Obstetrics & Gynecology

## 2012-03-09 ENCOUNTER — Encounter (HOSPITAL_COMMUNITY)
Admission: RE | Disposition: A | Discharge status: Home / Self Care | Payer: Self-pay | Source: Ambulatory Visit | Attending: Obstetrics & Gynecology

## 2012-03-09 ENCOUNTER — Encounter (HOSPITAL_COMMUNITY): Payer: Self-pay | Admitting: Anesthesiology

## 2012-03-09 ENCOUNTER — Encounter (HOSPITAL_COMMUNITY): Payer: Self-pay | Admitting: *Deleted

## 2012-03-09 DIAGNOSIS — Z01812 Encounter for preprocedural laboratory examination: Secondary | ICD-10-CM | POA: Insufficient documentation

## 2012-03-09 DIAGNOSIS — N920 Excessive and frequent menstruation with regular cycle: Secondary | ICD-10-CM | POA: Insufficient documentation

## 2012-03-09 DIAGNOSIS — Z9889 Other specified postprocedural states: Secondary | ICD-10-CM

## 2012-03-09 DIAGNOSIS — N946 Dysmenorrhea, unspecified: Secondary | ICD-10-CM | POA: Insufficient documentation

## 2012-03-09 HISTORY — PX: DILITATION & CURRETTAGE/HYSTROSCOPY WITH THERMACHOICE ABLATION: SHX5569

## 2012-03-09 SURGERY — DILATATION & CURETTAGE/HYSTEROSCOPY WITH THERMACHOICE ABLATION
Anesthesia: General | Wound class: Clean Contaminated

## 2012-03-09 MED ORDER — MIDAZOLAM HCL 2 MG/2ML IJ SOLN
INTRAMUSCULAR | Status: AC
  Filled 2012-03-09: qty 2

## 2012-03-09 MED ORDER — CEFAZOLIN SODIUM-DEXTROSE 2-3 GM-% IV SOLR
INTRAVENOUS | Status: DC | PRN
  Administered 2012-03-09: 2 g via INTRAVENOUS

## 2012-03-09 MED ORDER — KETOROLAC TROMETHAMINE 30 MG/ML IJ SOLN
INTRAMUSCULAR | Status: AC
  Filled 2012-03-09: qty 1

## 2012-03-09 MED ORDER — DEXAMETHASONE SODIUM PHOSPHATE 4 MG/ML IJ SOLN
4.0000 mg | Freq: Once | INTRAMUSCULAR | Status: AC
  Administered 2012-03-09: 4 mg via INTRAVENOUS

## 2012-03-09 MED ORDER — FENTANYL CITRATE 0.05 MG/ML IJ SOLN
25.0000 ug | INTRAMUSCULAR | Status: DC | PRN
  Administered 2012-03-09 (×4): 50 ug via INTRAVENOUS

## 2012-03-09 MED ORDER — FENTANYL CITRATE 0.05 MG/ML IJ SOLN
INTRAMUSCULAR | Status: DC | PRN
  Administered 2012-03-09: 25 ug via INTRAVENOUS
  Administered 2012-03-09: 50 ug via INTRAVENOUS
  Administered 2012-03-09: 25 ug via INTRAVENOUS

## 2012-03-09 MED ORDER — FENTANYL CITRATE 0.05 MG/ML IJ SOLN
INTRAMUSCULAR | Status: AC
  Filled 2012-03-09: qty 2

## 2012-03-09 MED ORDER — PROPOFOL 10 MG/ML IV BOLUS
INTRAVENOUS | Status: DC | PRN
  Administered 2012-03-09: 160 mg via INTRAVENOUS
  Administered 2012-03-09: 20 mg via INTRAVENOUS

## 2012-03-09 MED ORDER — SCOPOLAMINE 1 MG/3DAYS TD PT72
MEDICATED_PATCH | TRANSDERMAL | Status: AC
  Filled 2012-03-09: qty 1

## 2012-03-09 MED ORDER — ONDANSETRON HCL 4 MG/2ML IJ SOLN: 4.0000 mg | Freq: Once | INTRAMUSCULAR | Status: DC | PRN

## 2012-03-09 MED ORDER — DEXTROSE 5 % IV SOLN
INTRAVENOUS | Status: DC | PRN
  Administered 2012-03-09: 500 mL via INTRAVENOUS

## 2012-03-09 MED ORDER — LIDOCAINE HCL 1 % IJ SOLN
INTRAMUSCULAR | Status: DC | PRN
  Administered 2012-03-09: 50 mg via INTRADERMAL

## 2012-03-09 MED ORDER — HYDROCODONE-IBUPROFEN 5-200 MG PO TABS: 2.0000 | ORAL_TABLET | ORAL | Status: DC | PRN

## 2012-03-09 MED ORDER — MIDAZOLAM HCL 5 MG/5ML IJ SOLN
INTRAMUSCULAR | Status: DC | PRN
  Administered 2012-03-09: 2 mg via INTRAVENOUS

## 2012-03-09 MED ORDER — SODIUM CHLORIDE 0.9 % IR SOLN
Status: DC | PRN
  Administered 2012-03-09: 3000 mL

## 2012-03-09 MED ORDER — DROPERIDOL 2.5 MG/ML IJ SOLN
INTRAMUSCULAR | Status: AC
  Filled 2012-03-09: qty 2

## 2012-03-09 MED ORDER — ONDANSETRON HCL 8 MG PO TABS: 8.0000 mg | ORAL_TABLET | Freq: Three times a day (TID) | ORAL | Status: DC | PRN

## 2012-03-09 MED ORDER — PROPOFOL 10 MG/ML IV EMUL
INTRAVENOUS | Status: AC
  Filled 2012-03-09: qty 20

## 2012-03-09 MED ORDER — SCOPOLAMINE 1 MG/3DAYS TD PT72
1.0000 | MEDICATED_PATCH | Freq: Once | TRANSDERMAL | Status: DC
  Administered 2012-03-09: 1.5 mg via TRANSDERMAL

## 2012-03-09 MED ORDER — DEXAMETHASONE SODIUM PHOSPHATE 4 MG/ML IJ SOLN
INTRAMUSCULAR | Status: AC
Start: 2012-03-09 — End: 2012-03-09
  Filled 2012-03-09: qty 1

## 2012-03-09 MED ORDER — CEFAZOLIN SODIUM-DEXTROSE 2-3 GM-% IV SOLR
INTRAVENOUS | Status: AC
  Filled 2012-03-09: qty 50

## 2012-03-09 MED ORDER — ONDANSETRON HCL 4 MG/2ML IJ SOLN
INTRAMUSCULAR | Status: AC
  Filled 2012-03-09: qty 2

## 2012-03-09 MED ORDER — KETOROLAC TROMETHAMINE 30 MG/ML IJ SOLN: 30.0000 mg | Freq: Once | INTRAMUSCULAR | Status: DC

## 2012-03-09 MED ORDER — ONDANSETRON HCL 4 MG/2ML IJ SOLN
4.0000 mg | Freq: Once | INTRAMUSCULAR | Status: AC
  Administered 2012-03-09: 4 mg via INTRAVENOUS

## 2012-03-09 MED ORDER — CEFAZOLIN SODIUM-DEXTROSE 2-3 GM-% IV SOLR: 2.0000 g | INTRAVENOUS | Status: DC

## 2012-03-09 MED ORDER — MIDAZOLAM HCL 2 MG/2ML IJ SOLN
1.0000 mg | INTRAMUSCULAR | Status: DC | PRN
  Administered 2012-03-09: 2 mg via INTRAVENOUS

## 2012-03-09 MED ORDER — DROPERIDOL 2.5 MG/ML IJ SOLN
INTRAMUSCULAR | Status: DC | PRN
  Administered 2012-03-09: 0.625 mg via INTRAVENOUS

## 2012-03-09 MED ORDER — KETOROLAC TROMETHAMINE 30 MG/ML IJ SOLN
INTRAMUSCULAR | Status: DC | PRN
  Administered 2012-03-09: 30 mg via INTRAVENOUS

## 2012-03-09 MED ORDER — LACTATED RINGERS IV SOLN
INTRAVENOUS | Status: DC
  Administered 2012-03-09: 1000 mL via INTRAVENOUS

## 2012-03-09 MED ORDER — LIDOCAINE HCL (PF) 1 % IJ SOLN
INTRAMUSCULAR | Status: AC
  Filled 2012-03-09: qty 5

## 2012-03-09 SURGICAL SUPPLY — 29 items
BAG DECANTER FOR FLEXI CONT (MISCELLANEOUS) ×2 IMPLANT
BAG HAMPER (MISCELLANEOUS) ×2 IMPLANT
CATH THERMACHOICE III (CATHETERS) ×2 IMPLANT
CLOTH BEACON ORANGE TIMEOUT ST (SAFETY) ×2 IMPLANT
COVER LIGHT HANDLE STERIS (MISCELLANEOUS) ×4 IMPLANT
FORMALIN 10 PREFIL 120ML (MISCELLANEOUS) ×2 IMPLANT
GAUZE SPONGE 4X4 16PLY XRAY LF (GAUZE/BANDAGES/DRESSINGS) ×2 IMPLANT
GLOVE BIOGEL PI IND STRL 8 (GLOVE) ×1 IMPLANT
GLOVE BIOGEL PI INDICATOR 8 (GLOVE) ×1
GLOVE ECLIPSE 6.5 STRL STRAW (GLOVE) ×2 IMPLANT
GLOVE ECLIPSE 8.0 STRL XLNG CF (GLOVE) ×2 IMPLANT
GLOVE EXAM NITRILE MD LF STRL (GLOVE) ×2 IMPLANT
GLOVE INDICATOR 7.0 STRL GRN (GLOVE) ×2 IMPLANT
GOWN STRL REIN XL XLG (GOWN DISPOSABLE) ×4 IMPLANT
INST SET HYSTEROSCOPY (KITS) ×2 IMPLANT
IV D5W 500ML (IV SOLUTION) ×2 IMPLANT
IV NS IRRIG 3000ML ARTHROMATIC (IV SOLUTION) ×2 IMPLANT
KIT ROOM TURNOVER APOR (KITS) ×2 IMPLANT
MANIFOLD NEPTUNE II (INSTRUMENTS) ×2 IMPLANT
MARKER SKIN DUAL TIP RULER LAB (MISCELLANEOUS) ×2 IMPLANT
NS IRRIG 1000ML POUR BTL (IV SOLUTION) ×2 IMPLANT
PACK BASIC III (CUSTOM PROCEDURE TRAY) ×1
PACK SRG BSC III STRL LF ECLPS (CUSTOM PROCEDURE TRAY) ×1 IMPLANT
PAD ARMBOARD 7.5X6 YLW CONV (MISCELLANEOUS) ×2 IMPLANT
PAD TELFA 3X4 1S STER (GAUZE/BANDAGES/DRESSINGS) ×2 IMPLANT
SET BASIN LINEN APH (SET/KITS/TRAYS/PACK) ×2 IMPLANT
SET IRRIG Y TYPE TUR BLADDER L (SET/KITS/TRAYS/PACK) ×2 IMPLANT
SHEET LAVH (DRAPES) ×2 IMPLANT
YANKAUER SUCT BULB TIP 10FT TU (MISCELLANEOUS) ×2 IMPLANT

## 2012-03-09 NOTE — H&P (Signed)
Marissa Trevino is an 37 y.o. female. Multiparous status post BTL with worsening menometrorrhagia and dysmenorrhea which has been poorly responsive to megestrol.  Sonogram normal, patient now opts for endometrial ablation Patient's last menstrual period was 02/25/2012.    Past Medical History  Diagnosis Date  . PONV (postoperative nausea and vomiting)     Past Surgical History  Procedure Laterality Date  . Tubal ligation    . Cesarean section    . Tonsillectomy and adenoidectomy    . Cholecystectomy  16 yrsa ago    [facility]    History reviewed. No pertinent family history.  Social History:  reports that she has never smoked. She does not have any smokeless tobacco history on file. She reports that she does not drink alcohol or use illicit drugs.  Allergies:  Allergies  Allergen Reactions  . Codeine     Prescriptions prior to admission  Medication Sig Dispense Refill  . acetaminophen (TYLENOL) 500 MG tablet Take 1,000 mg by mouth every 6 (six) hours as needed for pain.       . Ascorbic Acid (VITAMIN C) 1000 MG tablet Take 1,000 mg by mouth daily.      . ciprofloxacin (CIPRO) 500 MG tablet Take 500 mg by mouth 2 (two) times daily. For 7 days. (started 03/06/2012)      . ibuprofen (ADVIL,MOTRIN) 200 MG tablet Take 800 mg by mouth every 6 (six) hours as needed for pain.      . megestrol (MEGACE) 20 MG tablet Take 40 mg by mouth daily.      . metroNIDAZOLE (FLAGYL) 500 MG tablet Take 500 mg by mouth 2 (two) times daily. For 7 days ( started 03/06/2012)        ROS  Review of Systems  Constitutional: Negative for fever, chills, weight loss, malaise/fatigue and diaphoresis.  HENT: Negative for hearing loss, ear pain, nosebleeds, congestion, sore throat, neck pain, tinnitus and ear discharge.   Eyes: Negative for blurred vision, double vision, photophobia, pain, discharge and redness.  Respiratory: Negative for cough, hemoptysis, sputum production, shortness of breath, wheezing and stridor.    Cardiovascular: Negative for chest pain, palpitations, orthopnea, claudication, leg swelling and PND.  Gastrointestinal: Negative for abdominal pain. Negative for heartburn, nausea, vomiting, diarrhea, constipation, blood in stool and melena.  Genitourinary: Negative for dysuria, urgency, frequency, hematuria and flank pain.  Musculoskeletal: Negative for myalgias, back pain, joint pain and falls.  Skin: Negative for itching and rash.  Neurological: Negative for dizziness, tingling, tremors, sensory change, speech change, focal weakness, seizures, loss of consciousness, weakness and headaches.  Endo/Heme/Allergies: Negative for environmental allergies and polydipsia. Does not bruise/bleed easily.  Psychiatric/Behavioral: Negative for depression, suicidal ideas, hallucinations, memory loss and substance abuse. The patient is not nervous/anxious and does not have insomnia.     Blood pressure 132/84, pulse 90, temperature 98.6 F (37 C), temperature source Oral, resp. rate 20, last menstrual period 02/25/2012, SpO2 99.00%. Physical Exam Physical Exam  Vitals reviewed. Constitutional: She is oriented to person, place, and time. She appears well-developed and well-nourished.  HENT:  Head: Normocephalic and atraumatic.  Right Ear: External ear normal.  Left Ear: External ear normal.  Nose: Nose normal.  Mouth/Throat: Oropharynx is clear and moist.  Eyes: Conjunctivae and EOM are normal. Pupils are equal, round, and reactive to light. Right eye exhibits no discharge. Left eye exhibits no discharge. No scleral icterus.  Neck: Normal range of motion. Neck supple. No tracheal deviation present. No thyromegaly present.  Cardiovascular:  Normal rate, regular rhythm, normal heart sounds and intact distal pulses.  Exam reveals no gallop and no friction rub.   No murmur heard. Respiratory: Effort normal and breath sounds normal. No respiratory distress. She has no wheezes. She has no rales. She exhibits  no tenderness.  GI: Soft. Bowel sounds are normal. She exhibits no distension and no mass. There is tenderness. There is no rebound and no guarding.  Genitourinary:       Vulva is normal without lesions Vagina is pink moist without discharge Cervix normal in appearance and pap is normal Uterus is normal by sonogram Adnexa is negative with normal sized ovaries by sonogram  Musculoskeletal: Normal range of motion. She exhibits no edema and no tenderness.  Neurological: She is alert and oriented to person, place, and time. She has normal reflexes. She displays normal reflexes. No cranial nerve deficit. She exhibits normal muscle tone. Coordination normal.  Skin: Skin is warm and dry. No rash noted. No erythema. No pallor.  Psychiatric: She has a normal mood and affect. Her behavior is normal. Judgment and thought content normal.    Results for orders placed during the hospital encounter of 03/07/12 (from the past 336 hour(s))  CBC   Collection Time    03/07/12  3:00 PM      Result Value Range   WBC 6.9  4.0 - 10.5 K/uL   RBC 4.25  3.87 - 5.11 MIL/uL   Hemoglobin 11.6 (*) 12.0 - 15.0 g/dL   HCT 96.0 (*) 45.4 - 09.8 %   MCV 81.6  78.0 - 100.0 fL   MCH 27.3  26.0 - 34.0 pg   MCHC 33.4  30.0 - 36.0 g/dL   RDW 11.9 (*) 14.7 - 82.9 %   Platelets 276  150 - 400 K/uL  COMPREHENSIVE METABOLIC PANEL   Collection Time    03/07/12  3:00 PM      Result Value Range   Sodium 138  135 - 145 mEq/L   Potassium 3.8  3.5 - 5.1 mEq/L   Chloride 106  96 - 112 mEq/L   CO2 24  19 - 32 mEq/L   Glucose, Bld 91  70 - 99 mg/dL   BUN 10  6 - 23 mg/dL   Creatinine, Ser 5.62  0.50 - 1.10 mg/dL   Calcium 9.4  8.4 - 13.0 mg/dL   Total Protein 6.7  6.0 - 8.3 g/dL   Albumin 3.9  3.5 - 5.2 g/dL   AST 13  0 - 37 U/L   ALT 12  0 - 35 U/L   Alkaline Phosphatase 57  39 - 117 U/L   Total Bilirubin 0.1 (*) 0.3 - 1.2 mg/dL   GFR calc non Af Amer >90  >90 mL/min   GFR calc Af Amer >90  >90 mL/min  HCG,  QUANTITATIVE, PREGNANCY   Collection Time    03/07/12  3:00 PM      Result Value Range   hCG, Beta Chain, Quant, S <1  <5 mIU/mL  SURGICAL PCR SCREEN   Collection Time    03/07/12  3:03 PM      Result Value Range   MRSA, PCR POSITIVE (*) NEGATIVE   Staphylococcus aureus POSITIVE (*) NEGATIVE  URINALYSIS, ROUTINE W REFLEX MICROSCOPIC   Collection Time    03/07/12  3:03 PM      Result Value Range   Color, Urine YELLOW  YELLOW   APPearance CLEAR  CLEAR   Specific Gravity, Urine >1.030 (*)  1.005 - 1.030   pH 6.0  5.0 - 8.0   Glucose, UA NEGATIVE  NEGATIVE mg/dL   Hgb urine dipstick MODERATE (*) NEGATIVE   Bilirubin Urine NEGATIVE  NEGATIVE   Ketones, ur NEGATIVE  NEGATIVE mg/dL   Protein, ur NEGATIVE  NEGATIVE mg/dL   Urobilinogen, UA 0.2  0.0 - 1.0 mg/dL   Nitrite NEGATIVE  NEGATIVE   Leukocytes, UA NEGATIVE  NEGATIVE  URINE MICROSCOPIC-ADD ON   Collection Time    03/07/12  3:03 PM      Result Value Range   Squamous Epithelial / LPF MANY (*) RARE   WBC, UA 0-2  <3 WBC/hpf   RBC / HPF 7-10  <3 RBC/hpf   Bacteria, UA FEW (*) RARE   Crystals CA OXALATE CRYSTALS (*) NEGATIVE       Assessment/Plan: 1.  Menometrorrhaia 2.  Dysmenorrhea  Proceed with hysteroscopy uterine curettage endometrial ablation.  Treston Coker H 03/09/2012, 8:39 AM

## 2012-03-09 NOTE — Op Note (Signed)
Preoperative diagnosis: Menometrorrhagia                                        Dysmenorrhea   Postoperative diagnoses: Same as above   Procedure: Hysteroscopy, uterine curettage, endometrial ablation  Surgeon: Despina Hidden MD  Anesthesia: Laryngeal mask airway  Findings: The endometrium was normal. There were no fibroid or other abnormalities.  Description of operation: The patient was taken to the operating room and placed in the supine position. She underwent general anesthesia using the laryngeal mask airway. She was placed in the dorsal lithotomy position and prepped and draped in the usual sterile fashion. A Graves speculum was placed and the anterior cervical lip was grasped with a single-tooth tenaculum. The cervix was dilated serially to allow passage of the hysteroscope. Diagnostic hysteroscopy was performed and was found to be normal. A vigorous uterine curettage was then performed and all tissue sent to pathology for evaluation. The ThermaChoice 3 endometrial ablation balloon was then used were 35 cc of D5W was required to maintain a pressure of 190-200 mm of mercury throughout the procedure. Toatl therapy time was 9:39.  All of the equipment worked well throughout the procedure. All of the fluid was returned at the end of the procedure. The patient was awakened from anesthesia and taken to the recovery room in good stable condition all counts were correct. She received 2 g of Ancef and 30 mg of Toradol preoperatively. She will be discharged from the recovery room and followed up in the office in 2 weeks.  EURE,LUTHER H 03/09/2012 9:58 AM

## 2012-03-09 NOTE — Transfer of Care (Signed)
Immediate Anesthesia Transfer of Care Note  Patient: Marissa Trevino  Procedure(s) Performed: Procedure(s) with comments: DILATATION & CURETTAGE/HYSTEROSCOPY WITH THERMACHOICE ABLATION (N/A) - D5W in -53ml,out 35ml, Total therapy time:72min 39sec Temperture 87 degree c.  Patient Location: PACU  Anesthesia Type:General  Level of Consciousness: awake and patient cooperative  Airway & Oxygen Therapy: Patient Spontanous Breathing and Patient connected to face mask oxygen  Post-op Assessment: Report given to PACU RN, Post -op Vital signs reviewed and stable and Patient moving all extremities  Post vital signs: Reviewed and stable  Complications: No apparent anesthesia complications

## 2012-03-09 NOTE — Anesthesia Postprocedure Evaluation (Signed)
  Anesthesia Post-op Note  Patient: Marissa Trevino  Procedure(s) Performed: Procedure(s) with comments: DILATATION & CURETTAGE/HYSTEROSCOPY WITH THERMACHOICE ABLATION (N/A) - D5W in -35ml,out 35ml, Total therapy time:22min 39sec Temperture 87 degree c.  Patient Location: PACU  Anesthesia Type:General  Level of Consciousness: awake, alert , oriented and patient cooperative  Airway and Oxygen Therapy: Patient Spontanous Breathing  Post-op Pain: 3 /10, mild  Post-op Assessment: Post-op Vital signs reviewed, Patient's Cardiovascular Status Stable, Respiratory Function Stable, Patent Airway and Pain level controlled  Post-op Vital Signs: Reviewed and stable  Complications: No apparent anesthesia complications

## 2012-03-09 NOTE — Anesthesia Preprocedure Evaluation (Signed)
Anesthesia Evaluation  Patient identified by MRN, date of birth, ID band Patient awake    Reviewed: Allergy & Precautions, H&P , NPO status , Patient's Chart, lab work & pertinent test results  History of Anesthesia Complications (+) PONV  Airway Mallampati: I TM Distance: >3 FB     Dental  (+) Teeth Intact   Pulmonary neg pulmonary ROS,  breath sounds clear to auscultation        Cardiovascular negative cardio ROS  Rhythm:Regular Rate:Normal     Neuro/Psych    GI/Hepatic   Endo/Other    Renal/GU      Musculoskeletal   Abdominal   Peds  Hematology   Anesthesia Other Findings   Reproductive/Obstetrics                           Anesthesia Physical Anesthesia Plan  ASA: I  Anesthesia Plan: General   Post-op Pain Management:    Induction: Intravenous  Airway Management Planned: LMA  Additional Equipment:   Intra-op Plan:   Post-operative Plan: Extubation in OR  Informed Consent: I have reviewed the patients History and Physical, chart, labs and discussed the procedure including the risks, benefits and alternatives for the proposed anesthesia with the patient or authorized representative who has indicated his/her understanding and acceptance.     Plan Discussed with:   Anesthesia Plan Comments:         Anesthesia Quick Evaluation

## 2012-03-09 NOTE — Anesthesia Procedure Notes (Addendum)
Procedure Name: LMA Insertion Date/Time: 03/09/2012 9:12 AM Performed by: Despina Hidden Pre-anesthesia Checklist: Patient identified, Patient being monitored, Emergency Drugs available and Suction available Patient Re-evaluated:Patient Re-evaluated prior to inductionOxygen Delivery Method: Circle system utilized Preoxygenation: Pre-oxygenation with 100% oxygen Intubation Type: IV induction Ventilation: Mask ventilation without difficulty LMA: LMA inserted LMA Size: 3.0 Tube type: Oral Number of attempts: 1 Placement Confirmation: positive ETCO2 and breath sounds checked- equal and bilateral Tube secured with: Tape Dental Injury: Teeth and Oropharynx as per pre-operative assessment

## 2012-03-13 ENCOUNTER — Encounter (HOSPITAL_COMMUNITY): Payer: Self-pay | Admitting: Obstetrics & Gynecology

## 2012-04-16 ENCOUNTER — Ambulatory Visit: Payer: Self-pay | Admitting: Obstetrics & Gynecology

## 2012-04-20 ENCOUNTER — Telehealth: Payer: Self-pay | Admitting: *Deleted

## 2012-04-20 MED ORDER — CIPROFLOXACIN HCL 500 MG PO TABS: 500.0000 mg | ORAL_TABLET | Freq: Two times a day (BID) | ORAL | Status: DC

## 2012-04-20 NOTE — Telephone Encounter (Signed)
Unable to reach pt by phone.  Do you want to prescribe something  for UTI symptoms?

## 2012-05-01 ENCOUNTER — Telehealth: Payer: Self-pay | Admitting: Obstetrics and Gynecology

## 2012-05-01 NOTE — Telephone Encounter (Signed)
Spoke with pt. Was on antibiotic last week; thought she may have a yeast infection, but then started saying she was having pain "up inside of me." Constipated, straining with bowel movements, vaginal bleeding. Advised would need to be seen. Pt to check work schedule and call tomorrow to make an appt.

## 2012-05-01 NOTE — Telephone Encounter (Signed)
Left message. JSY 

## 2012-05-02 ENCOUNTER — Encounter: Payer: Self-pay | Admitting: Adult Health

## 2012-05-02 ENCOUNTER — Ambulatory Visit (INDEPENDENT_AMBULATORY_CARE_PROVIDER_SITE_OTHER): Payer: 59 | Admitting: Adult Health

## 2012-05-02 VITALS — BP 120/80 | Ht 63.0 in | Wt 186.0 lb

## 2012-05-02 DIAGNOSIS — A499 Bacterial infection, unspecified: Secondary | ICD-10-CM

## 2012-05-02 DIAGNOSIS — N760 Acute vaginitis: Secondary | ICD-10-CM

## 2012-05-02 DIAGNOSIS — B9689 Other specified bacterial agents as the cause of diseases classified elsewhere: Secondary | ICD-10-CM

## 2012-05-02 DIAGNOSIS — R319 Hematuria, unspecified: Secondary | ICD-10-CM

## 2012-05-02 HISTORY — DX: Other specified bacterial agents as the cause of diseases classified elsewhere: N76.0

## 2012-05-02 HISTORY — DX: Other specified bacterial agents as the cause of diseases classified elsewhere: B96.89

## 2012-05-02 LAB — POCT URINALYSIS DIPSTICK
Glucose, UA: NEGATIVE
Leukocytes, UA: NEGATIVE
Nitrite, UA: NEGATIVE
Protein, UA: NEGATIVE

## 2012-05-02 LAB — POCT WET PREP (WET MOUNT)

## 2012-05-02 MED ORDER — METRONIDAZOLE 0.75 % VA GEL: VAGINAL | Status: DC

## 2012-05-02 NOTE — Progress Notes (Signed)
Subjective:     Patient ID: Candis Schatz, female   DOB: April 12, 1975, 37 y.o.   MRN: [id number]  HPI Kawanna is a 37 year old white female in today complaining of a vaginal itch. No odor, but will see pinkish discharge if strains with BM.   Review of SystemsNo problems voiding, has vaginal itch, and pink discharge with straining. Has not had sex in 8 months. Recently had endometrial ablation in February 2014.    Reviewed past medical, surgical, social and family history. Reviewed medications and allergies. Objective:   Physical Exam Pelvic: External genitalia is normal in appearance, the vagina has a light discharge, the cervix is smooth and bulbous, uterus is felt to be normal size, shape and contour, no adnexal masses or tenderness noted. Wet prep was performed and was positive for clue cells. And urine was trace blood.     Assessment:      BV Hematuria    Plan:      Rx Metrogel vaginal gel, 1 applicator at bedtime x 5 nights Will get UA/C&S,  Call if persists

## 2012-05-02 NOTE — Patient Instructions (Addendum)
Bacterial Vaginosis Bacterial vaginosis (BV) is a vaginal infection where the normal balance of bacteria in the vagina is disrupted. The normal balance is then replaced by an overgrowth of certain bacteria. There are several different kinds of bacteria that can cause BV. BV is the most common vaginal infection in women of childbearing age. CAUSES   The cause of BV is not fully understood. BV develops when there is an increase or imbalance of harmful bacteria.  Some activities or behaviors can upset the normal balance of bacteria in the vagina and put women at increased risk including:  Having a new sex partner or multiple sex partners.  Douching.  Using an intrauterine device (IUD) for contraception.  It is not clear what role sexual activity plays in the development of BV. However, women that have never had sexual intercourse are rarely infected with BV. Women do not get BV from toilet seats, bedding, swimming pools or from touching objects around them.  SYMPTOMS   Grey vaginal discharge.  A fish-like odor with discharge, especially after sexual intercourse.  Itching or burning of the vagina and vulva.  Burning or pain with urination.  Some women have no signs or symptoms at all. DIAGNOSIS  Your caregiver must examine the vagina for signs of BV. Your caregiver will perform lab tests and look at the sample of vaginal fluid through a microscope. They will look for bacteria and abnormal cells (clue cells), a pH test higher than 4.5, and a positive amine test all associated with BV.  RISKS AND COMPLICATIONS   Pelvic inflammatory disease (PID).  Infections following gynecology surgery.  Developing HIV.  Developing herpes virus. TREATMENT  Sometimes BV will clear up without treatment. However, all women with symptoms of BV should be treated to avoid complications, especially if gynecology surgery is planned. Female partners generally do not need to be treated. However, BV may spread  between female sex partners so treatment is helpful in preventing a recurrence of BV.   BV may be treated with antibiotics. The antibiotics come in either pill or vaginal cream forms. Either can be used with nonpregnant or pregnant women, but the recommended dosages differ. These antibiotics are not harmful to the baby.  BV can recur after treatment. If this happens, a second round of antibiotics will often be prescribed.  Treatment is important for pregnant women. If not treated, BV can cause a premature delivery, especially for a pregnant woman who had a premature birth in the past. All pregnant women who have symptoms of BV should be checked and treated.  For chronic reoccurrence of BV, treatment with a type of prescribed gel vaginally twice a week is helpful. HOME CARE INSTRUCTIONS   Finish all medication as directed by your caregiver.  Do not have sex until treatment is completed.  Tell your sexual partner that you have a vaginal infection. They should see their caregiver and be treated if they have problems, such as a mild rash or itching.  Practice safe sex. Use condoms. Only have 1 sex partner. PREVENTION  Basic prevention steps can help reduce the risk of upsetting the natural balance of bacteria in the vagina and developing BV:  Do not have sexual intercourse (be abstinent).  Do not douche.  Use all of the medicine prescribed for treatment of BV, even if the signs and symptoms go away.  Tell your sex partner if you have BV. That way, they can be treated, if needed, to prevent reoccurrence. SEEK MEDICAL CARE IF:     Your symptoms are not improving after 3 days of treatment.  You have increased discharge, pain, or fever. MAKE SURE YOU:   Understand these instructions.  Will watch your condition.  Will get help right away if you are not doing well or get worse. FOR MORE INFORMATION  Division of STD Prevention (DSTDP), Centers for Disease Control and Prevention:  [employer] American Social Health Association (ASHA): www.ashastd.org  Document Released: 01/10/2005 Document Revised: 04/04/2011 Document Reviewed: 07/03/2008 [facility] Patient Information 2013 [place], [place]. Call prn  Sign up for my chart

## 2012-05-03 LAB — URINALYSIS
Ketones, ur: NEGATIVE mg/dL
Leukocytes, UA: NEGATIVE
Nitrite: NEGATIVE
Specific Gravity, Urine: 1.015 (ref 1.005–1.030)
Urobilinogen, UA: 0.2 mg/dL (ref 0.0–1.0)
pH: 7 (ref 5.0–8.0)

## 2012-05-04 LAB — URINE CULTURE: Colony Count: 3000

## 2012-05-07 ENCOUNTER — Telehealth: Payer: Self-pay | Admitting: Adult Health

## 2012-05-07 MED ORDER — FLUCONAZOLE 150 MG PO TABS: ORAL_TABLET | ORAL | Status: DC

## 2012-05-07 NOTE — Telephone Encounter (Signed)
Complains of itching using metrogel will rx diflucan

## 2012-05-09 ENCOUNTER — Encounter: Payer: Self-pay | Admitting: *Deleted

## 2012-05-09 ENCOUNTER — Telehealth: Payer: Self-pay | Admitting: *Deleted

## 2012-05-09 NOTE — Telephone Encounter (Signed)
Has blood in urine come in tomorrow to be seen

## 2012-05-09 NOTE — Telephone Encounter (Signed)
Pt states she is still having "pain down there", she also had dark red blood in her pee again. She wants to know what she needs to do.

## 2012-05-10 ENCOUNTER — Ambulatory Visit (INDEPENDENT_AMBULATORY_CARE_PROVIDER_SITE_OTHER): Payer: 59 | Admitting: Adult Health

## 2012-05-10 ENCOUNTER — Encounter: Payer: Self-pay | Admitting: Adult Health

## 2012-05-10 VITALS — BP 120/78 | Ht 63.0 in | Wt 191.0 lb

## 2012-05-10 DIAGNOSIS — R319 Hematuria, unspecified: Secondary | ICD-10-CM

## 2012-05-10 DIAGNOSIS — R52 Pain, unspecified: Secondary | ICD-10-CM

## 2012-05-10 DIAGNOSIS — R102 Pelvic and perineal pain: Secondary | ICD-10-CM

## 2012-05-10 DIAGNOSIS — N949 Unspecified condition associated with female genital organs and menstrual cycle: Secondary | ICD-10-CM

## 2012-05-10 LAB — POCT URINALYSIS DIPSTICK
Blood, UA: 1
Nitrite, UA: NEGATIVE
Protein, UA: NEGATIVE

## 2012-05-10 MED ORDER — DOXYCYCLINE HYCLATE 100 MG PO CAPS: 100.0000 mg | ORAL_CAPSULE | Freq: Two times a day (BID) | ORAL | Status: DC

## 2012-05-10 NOTE — Patient Instructions (Addendum)
Rx. Doxycycline  Increase water Call me in follow up

## 2012-05-10 NOTE — Progress Notes (Signed)
Subjective:     Patient ID: Marissa Trevino, female   DOB: 1975-09-26, 37 y.o.   MRN: [id number]  HPI Marissa Trevino is a 37 year old white female in with pelvic ache and seeing blood when she voids. Recently treated with metrogel and diflucan.Had endometrial ablation in February.   Review of SystemsComplaints as above.   Reviewed past medical,surgical, social and family history. Reviewed medications and allergies.     Objective:   Physical Exam Blood pressure 120/78, height 5\' 3"  (1.6 m), weight 191 lb (86.637 kg).   Skin warm and dry. Pelvic: external genitalia normal in appearance, vagina has metrogel present, cervix bulbous with negative CMT, uterus felt to be NSSC but tender , no adnexal masses but tender to palpation. Discussed with Dr. Despina Hidden Assessment:      Pelvic pain    Plan:      Rx. Doxycycline 100 mg 1 bid x 21 days Increase water  Follow up next week by phone

## 2012-05-24 ENCOUNTER — Ambulatory Visit (INDEPENDENT_AMBULATORY_CARE_PROVIDER_SITE_OTHER): Payer: 59 | Admitting: Obstetrics and Gynecology

## 2012-05-24 ENCOUNTER — Encounter: Payer: Self-pay | Admitting: Obstetrics and Gynecology

## 2012-05-24 VITALS — BP 110/80 | Ht 63.0 in | Wt 185.6 lb

## 2012-05-24 DIAGNOSIS — R102 Pelvic and perineal pain: Secondary | ICD-10-CM

## 2012-05-24 DIAGNOSIS — N854 Malposition of uterus: Secondary | ICD-10-CM | POA: Insufficient documentation

## 2012-05-24 DIAGNOSIS — N949 Unspecified condition associated with female genital organs and menstrual cycle: Secondary | ICD-10-CM

## 2012-05-24 DIAGNOSIS — G8929 Other chronic pain: Secondary | ICD-10-CM

## 2012-05-24 NOTE — Patient Instructions (Addendum)
Do self exam to see if uterine contact reproduces the pain

## 2012-05-24 NOTE — Progress Notes (Signed)
  Assessment:  Pelvic pain due to uterine descensus, long-standing with uterine retroversion.   Plan:  Patient is to do self-exam, feeling of the cervix and the retroflexed uterus to see if this causes her uterine and pelvic pain. Return for review of systems in 6 weeks Subjective:  Marissa Trevino is a 37 y.o. female, G3P3, who presents for deep vaginal pain, which prevents sex and has not been improved by the endometrial ablation performed earlier this year she has had improved menstrual control with no bleeding until last 2 days when she had some light blood was noted after urination.  The following portions of the patient's history were reviewed and updated as appropriate: allergies, current medications, past medical & surgical history, & past family history.   [place] of Systems Pertinent items are noted in HPI. rectal bleeding GU: Negative for dysuria, frequency, urgency or incontinence.   GYN: No LMP recorded. Patient has had an ablation.   Objective:  BP 110/80  Ht 5\' 3"  (1.6 m)  Wt 185 lb 9.6 oz (84.188 kg)  BMI 32.89 kg/m2    BMI: Body mass index is 32.89 kg/(m^2).  General Appearance: Alert, appropriate appearance for age. No acute distress HEENT: Grossly normal Neck / Thyroid: Supple, no masses, nodes or enlargement Cardiovascular: Regular rate and rhythm. S1, S2, no murmur Lungs: Clear to auscultation bilaterally Back: No CVA tenderness Gastrointestinal: Soft, non-tender, no masses or organomegaly Pelvic Exam: Retroverted uterus, descends to within 2 cm of the introitus. There is posterior fibroid which is easily contacted on bimanual exam Rectovaginal: not indicated  Christin Bach MD

## 2012-06-19 DIAGNOSIS — Z0289 Encounter for other administrative examinations: Secondary | ICD-10-CM

## 2012-06-25 ENCOUNTER — Telehealth: Payer: Self-pay | Admitting: *Deleted

## 2012-06-25 MED ORDER — NITROFURANTOIN MONOHYD MACRO 100 MG PO CAPS: 100.0000 mg | ORAL_CAPSULE | Freq: Two times a day (BID) | ORAL | Status: DC

## 2012-06-25 NOTE — Telephone Encounter (Signed)
Left message that Macrobid at [facility]

## 2012-06-25 NOTE — Telephone Encounter (Signed)
Pt requesting medication to be e-scribed for UTI?

## 2012-06-29 ENCOUNTER — Telehealth: Payer: Self-pay | Admitting: *Deleted

## 2012-06-29 NOTE — Telephone Encounter (Signed)
Pt c/o burning and pain with urination even after taking Macrobid. Per Cyril Mourning, NP, pt to push fluids and keep appointment for next Thursday. Pt verbalized understanding.

## 2012-07-05 ENCOUNTER — Ambulatory Visit (INDEPENDENT_AMBULATORY_CARE_PROVIDER_SITE_OTHER): Payer: 59 | Admitting: Obstetrics and Gynecology

## 2012-07-06 ENCOUNTER — Ambulatory Visit (INDEPENDENT_AMBULATORY_CARE_PROVIDER_SITE_OTHER): Payer: 59 | Admitting: Obstetrics and Gynecology

## 2012-07-06 ENCOUNTER — Encounter: Payer: Self-pay | Admitting: Obstetrics and Gynecology

## 2012-07-06 VITALS — BP 112/76 | Ht 63.0 in | Wt 193.4 lb

## 2012-07-06 DIAGNOSIS — R300 Dysuria: Secondary | ICD-10-CM

## 2012-07-06 DIAGNOSIS — R309 Painful micturition, unspecified: Secondary | ICD-10-CM

## 2012-07-06 LAB — POCT URINALYSIS DIPSTICK: Glucose, UA: NEGATIVE

## 2012-07-06 MED ORDER — SULFAMETHOXAZOLE-TMP DS 800-160 MG PO TABS: 1.0000 | ORAL_TABLET | Freq: Two times a day (BID) | ORAL | Status: DC

## 2012-07-06 MED ORDER — UROGESIC-BLUE 81.6 MG PO TABS: 1.0000 | ORAL_TABLET | Freq: Four times a day (QID) | ORAL | Status: DC

## 2012-07-06 NOTE — Patient Instructions (Signed)
Interstitial Cystitis Interstitial cystitis (IC) is a condition that results in discomfort or pain in the bladder and the surrounding pelvic region. The symptoms can be different from case to case and even in the same individual. People may experience:  Mild discomfort.  Pressure.  Tenderness.  Intense pain in the bladder and pelvic area. CAUSES  Because IC varies so much in symptoms and severity, people studying this disease believe it is not one but several diseases. Some caregivers use the term painful bladder syndrome (PBS) to describe cases with painful urinary symptoms. This may not meet the strictest definition of IC. The term IC / PBS includes all cases of urinary pain that cannot be connected to other causes, such as infection or urinary stones.  SYMPTOMS  Symptoms may include:  An urgent need to urinate.  A frequent need to urinate.  A combination of these symptoms. Pain may change in intensity as the bladder fills with urine or as it empties. Women's symptoms often get worse during menstruation. They may sometimes experience pain with vaginal intercourse. Some of the symptoms of IC / PBS seem like those of bacterial infection. Tests do not show infection. IC / PBS is far more common in women than in men.  DIAGNOSIS  The diagnosis of IC / PBS is based on:  Presence of pain related to the bladder, usually along with problems of frequency and urgency.  Not finding other diseases that could cause the symptoms.  Diagnostic tests that help rule out other diseases include:  Urinalysis.  Urine culture.  Cystoscopy.  Biopsy of the bladder wall.  Distension of the bladder under anesthesia.  Urine cytology.  Laboratory examination of prostate secretions. A biopsy is a tissue sample that can be looked at under a microscope. Samples of the bladder and urethra may be removed during a cystoscopy. A biopsy helps rule out bladder cancer. TREATMENT  Scientists have not yet found  a cure for IC / PBS. Patients with IC / PBS do not get better with antibiotic therapy. Caregivers cannot predict who will respond best to which treatment. Symptoms may disappear without explanation. Disappearing symptoms may coincide with an event such as a change in diet or treatment. Even when symptoms disappear, they may return after days, weeks, months, or years.  Because the causes of IC / PBS are unknown, current treatments are aimed at relieving symptoms. Many people are helped by one or a combination of the treatments. As researchers learn more about IC / PBS, the list of potential treatments will change. Patients should discuss their options with a caregiver. SURGERY  Surgery should be considered only if all available treatments have failed and the pain is disabling. Many approaches and techniques are used. Each approach has its own advantages and complications. Advantages and complications should be discussed with a urologist. Your caregiver may recommend consulting another urologist for a second opinion. Most caregivers are reluctant to operate because the outcome is unpredictable. Some people still have symptoms after surgery.  People considering surgery should discuss the potential risks and benefits, side effects, and long- and short-term complications with their family, as well as with people who have already had the procedure. Surgery requires anesthesia, hospitalization, and in some cases weeks or months of recovery. As the complexity of the procedure increases, so do the chances for complications and for failure. HOME CARE INSTRUCTIONS   All drugs, even those sold over the counter, have side effects. Patients should always consult a caregiver before using any   drug for an extended amount of time. Only take over-the-counter or prescription medicines for pain, discomfort, or fever as directed by your caregiver.  Many patients feel that smoking makes their symptoms worse. How the by-products  of tobacco that are excreted in the urine affect IC / PBS is unknown. Smoking is the major known cause of bladder cancer. One of the best things smokers can do for their bladder and their overall health is to quit.  Many patients feel that gentle stretching exercises help relieve IC / PBS symptoms.  Methods vary, but basically patients decide to empty their bladder at designated times and use relaxation techniques and distractions to keep to the schedule. Gradually, patients try to lengthen the time between scheduled voids. A diary in which to record voiding times is usually helpful in keeping track of progress. MAKE SURE YOU:   Understand these instructions.  Will watch your condition.  Will get help right away if you are not doing well or get worse. Document Released: 09/11/2003 Document Revised: 04/04/2011 Document Reviewed: 11/26/2007 [facility] Patient Information 2014 [place], [place]. Urinary Tract Infection Urinary tract infections (UTIs) can develop anywhere along your urinary tract. Your urinary tract is your body's drainage system for removing wastes and extra water. Your urinary tract includes two kidneys, two ureters, a bladder, and a urethra. Your kidneys are a pair of bean-shaped organs. Each kidney is about the size of your fist. They are located below your ribs, one on each side of your spine. CAUSES Infections are caused by microbes, which are microscopic organisms, including fungi, viruses, and bacteria. These organisms are so small that they can only be seen through a microscope. Bacteria are the microbes that most commonly cause UTIs. SYMPTOMS  Symptoms of UTIs may vary by age and gender of the patient and by the location of the infection. Symptoms in young women typically include a frequent and intense urge to urinate and a painful, burning feeling in the bladder or urethra during urination. Older women and men are more likely to be tired, shaky, and weak and have muscle aches  and abdominal pain. A fever may mean the infection is in your kidneys. Other symptoms of a kidney infection include pain in your back or sides below the ribs, nausea, and vomiting. DIAGNOSIS To diagnose a UTI, your caregiver will ask you about your symptoms. Your caregiver also will ask to provide a urine sample. The urine sample will be tested for bacteria and white blood cells. White blood cells are made by your body to help fight infection. TREATMENT  Typically, UTIs can be treated with medication. Because most UTIs are caused by a bacterial infection, they usually can be treated with the use of antibiotics. The choice of antibiotic and length of treatment depend on your symptoms and the type of bacteria causing your infection. HOME CARE INSTRUCTIONS  If you were prescribed antibiotics, take them exactly as your caregiver instructs you. Finish the medication even if you feel better after you have only taken some of the medication.  Drink enough water and fluids to keep your urine clear or pale yellow.  Avoid caffeine, tea, and carbonated beverages. They tend to irritate your bladder.  Empty your bladder often. Avoid holding urine for long periods of time.  Empty your bladder before and after sexual intercourse.  After a bowel movement, women should cleanse from front to back. Use each tissue only once. SEEK MEDICAL CARE IF:   You have back pain.  You develop a  fever.  Your symptoms do not begin to resolve within 3 days. SEEK IMMEDIATE MEDICAL CARE IF:   You have severe back pain or lower abdominal pain.  You develop chills.  You have nausea or vomiting.  You have continued burning or discomfort with urination. MAKE SURE YOU:   Understand these instructions.  Will watch your condition.  Will get help right away if you are not doing well or get worse. Document Released: 10/20/2004 Document Revised: 07/12/2011 Document Reviewed: 02/18/2011 [facility] Patient Information 2014  [place], [place].

## 2012-07-06 NOTE — Progress Notes (Signed)
Patient ID: Marissa Trevino, female   DOB: 11-30-1975, 37 y.o.   MRN: [id number] Pt here today for pelvic pain, pain with urination. Terrible terrible pain with urination. Stabbing pain, "feels like a bunch of bees stinging me". Nothing helps. Tried OTC and prescription medicine with no relief.  Assessment:  uti, r/o IC Uterine retroversion, not the source of pain  Plan:  U/a, C&S Rx Urogesic Blue bid Rx septra ds bid.  Subjective:  Marissa Trevino is a 37 y.o. female, G3P3, who presents for constant pelvic ache , noted x 1 month. Recently rx'd empirically for uti Finished macrobid 5 day ago. No relief + frequency, nocturia x 4, voids 10 + times/day , just recently noted  The following portions of the patient's history were reviewed and updated as appropriate: allergies, current medications, past medical & surgical history, & past family history.  ignificant family history of breast or ovarian cancer. Grandmother +      Review of Systems Pertinent items are noted in HPI. Breast:Negative for breast lump,nipple discharge or nipple retraction Gastrointestinal: Negative for abdominal pain, change in bowel habits or rectal bleeding GU: Negative for dysuria, frequency, urgency or incontinence.   GYN: No LMP recorded. Patient has had an ablation.   Objective:  BP 112/76  Ht 5\' 3"  (1.6 m)  Wt 193 lb 6.4 oz (87.726 kg)  BMI 34.27 kg/m2    BMI: Body mass index is 34.27 kg/(m^2).  General Appearance: Alert, appropriate appearance for age. No acute distress HEENT: Grossly normal Neck / Thyroid: Supple, no masses, nodes or enlargement Cardiovascular: Regular rate and rhythm. S1, S2, no murmur Lungs: Clear to auscultation bilaterally Back: No CVA tenderness Gastrointestinal: Soft, non-tender, no masses or organomegaly Pelvic Exam: efg normal, uterus retroverted,  bladder severely sensitive to contact, vag siedwall normal Rectovaginal: not indicated  Tilda Burrow MD

## 2012-07-07 LAB — URINALYSIS
Bilirubin Urine: NEGATIVE
Glucose, UA: NEGATIVE mg/dL
Ketones, ur: NEGATIVE mg/dL
Protein, ur: NEGATIVE mg/dL
Specific Gravity, Urine: 1.014 (ref 1.005–1.030)

## 2012-07-08 LAB — URINE CULTURE
Colony Count: NO GROWTH
Organism ID, Bacteria: NO GROWTH

## 2012-07-09 ENCOUNTER — Telehealth: Payer: Self-pay | Admitting: Obstetrics and Gynecology

## 2012-07-09 MED ORDER — FLUCONAZOLE 150 MG PO TABS: ORAL_TABLET | ORAL | Status: DC

## 2012-07-09 NOTE — Telephone Encounter (Signed)
Pt given Septra DS on 07/06/2012, Pt states she thinks she has yeast infections now, requesting Diflucan to be prescribed.

## 2012-07-09 NOTE — Telephone Encounter (Signed)
Left message that diflucan called in to [facility].

## 2012-07-19 ENCOUNTER — Ambulatory Visit: Payer: 59 | Admitting: Adult Health

## 2012-10-02 ENCOUNTER — Telehealth: Payer: Self-pay | Admitting: *Deleted

## 2012-10-02 MED ORDER — NITROFURANTOIN MONOHYD MACRO 100 MG PO CAPS: 100.0000 mg | ORAL_CAPSULE | Freq: Two times a day (BID) | ORAL | Status: DC

## 2012-10-02 NOTE — Telephone Encounter (Signed)
Left message rx macrobid

## 2012-12-17 ENCOUNTER — Telehealth: Payer: Self-pay | Admitting: *Deleted

## 2012-12-17 NOTE — Telephone Encounter (Signed)
Complains of UTI symptoms to come in, in am can't come today, push fluids

## 2012-12-18 ENCOUNTER — Encounter: Payer: Self-pay | Admitting: Adult Health

## 2012-12-18 ENCOUNTER — Ambulatory Visit (INDEPENDENT_AMBULATORY_CARE_PROVIDER_SITE_OTHER): Payer: 59 | Admitting: Adult Health

## 2012-12-18 ENCOUNTER — Encounter (INDEPENDENT_AMBULATORY_CARE_PROVIDER_SITE_OTHER): Payer: Self-pay

## 2012-12-18 VITALS — BP 120/68 | Ht 63.0 in | Wt 195.0 lb

## 2012-12-18 DIAGNOSIS — R319 Hematuria, unspecified: Secondary | ICD-10-CM

## 2012-12-18 DIAGNOSIS — N390 Urinary tract infection, site not specified: Secondary | ICD-10-CM

## 2012-12-18 HISTORY — DX: Hematuria, unspecified: R31.9

## 2012-12-18 HISTORY — DX: Urinary tract infection, site not specified: N39.0

## 2012-12-18 LAB — POCT URINALYSIS DIPSTICK
Glucose, UA: NEGATIVE
Nitrite, UA: NEGATIVE

## 2012-12-18 MED ORDER — SULFAMETHOXAZOLE-TMP DS 800-160 MG PO TABS: 1.0000 | ORAL_TABLET | Freq: Two times a day (BID) | ORAL | Status: DC

## 2012-12-18 NOTE — Progress Notes (Signed)
Subjective:     Patient ID: Marissa Trevino, female   DOB: 04/22/1975, 37 y.o.   MRN: [id number]  HPI Marissa Trevino is a 37 year old white female in complaining of some pain at bladder and peeing blood at times. No fever or back pain at present.Voids often and some at night.  Review of Systems See HPI Reviewed past medical,surgical, social and family history. Reviewed medications and allergies.     Objective:   Physical Exam BP 120/68  Ht 5\' 3"  (1.6 m)  Wt 195 lb (88.451 kg)  BMI 34.55 kg/m2  LMP 11/11/2014Urine dipstick +blood and WBCs. No CVAT noted    Assessment:     Hematuria UTI    Plan:    Urine sent for UA C&S Rx septra ds 1 bid x 7 days Push fluids Review handout on UTI If symptoms persists may do IC work up

## 2012-12-18 NOTE — Patient Instructions (Signed)
Urinary Tract Infection Urinary tract infections (UTIs) can develop anywhere along your urinary tract. Your urinary tract is your body's drainage system for removing wastes and extra water. Your urinary tract includes two kidneys, two ureters, a bladder, and a urethra. Your kidneys are a pair of bean-shaped organs. Each kidney is about the size of your fist. They are located below your ribs, one on each side of your spine. CAUSES Infections are caused by microbes, which are microscopic organisms, including fungi, viruses, and bacteria. These organisms are so small that they can only be seen through a microscope. Bacteria are the microbes that most commonly cause UTIs. SYMPTOMS  Symptoms of UTIs may vary by age and gender of the patient and by the location of the infection. Symptoms in young women typically include a frequent and intense urge to urinate and a painful, burning feeling in the bladder or urethra during urination. Older women and men are more likely to be tired, shaky, and weak and have muscle aches and abdominal pain. A fever may mean the infection is in your kidneys. Other symptoms of a kidney infection include pain in your back or sides below the ribs, nausea, and vomiting. DIAGNOSIS To diagnose a UTI, your caregiver will ask you about your symptoms. Your caregiver also will ask to provide a urine sample. The urine sample will be tested for bacteria and white blood cells. White blood cells are made by your body to help fight infection. TREATMENT  Typically, UTIs can be treated with medication. Because most UTIs are caused by a bacterial infection, they usually can be treated with the use of antibiotics. The choice of antibiotic and length of treatment depend on your symptoms and the type of bacteria causing your infection. HOME CARE INSTRUCTIONS  If you were prescribed antibiotics, take them exactly as your caregiver instructs you. Finish the medication even if you feel better after you  have only taken some of the medication.  Drink enough water and fluids to keep your urine clear or pale yellow.  Avoid caffeine, tea, and carbonated beverages. They tend to irritate your bladder.  Empty your bladder often. Avoid holding urine for long periods of time.  Empty your bladder before and after sexual intercourse.  After a bowel movement, women should cleanse from front to back. Use each tissue only once. SEEK MEDICAL CARE IF:   You have back pain.  You develop a fever.  Your symptoms do not begin to resolve within 3 days. SEEK IMMEDIATE MEDICAL CARE IF:   You have severe back pain or lower abdominal pain.  You develop chills.  You have nausea or vomiting.  You have continued burning or discomfort with urination. MAKE SURE YOU:   Understand these instructions.  Will watch your condition.  Will get help right away if you are not doing well or get worse. Document Released: 10/20/2004 Document Revised: 07/12/2011 Document Reviewed: 02/18/2011 [facility] Patient Information 2014 [place], [place]. Take meds as directed

## 2012-12-19 LAB — URINALYSIS
Hgb urine dipstick: NEGATIVE
Ketones, ur: NEGATIVE mg/dL
Nitrite: NEGATIVE
Protein, ur: NEGATIVE mg/dL
Urobilinogen, UA: 0.2 mg/dL (ref 0.0–1.0)

## 2012-12-19 LAB — URINE CULTURE
Colony Count: NO GROWTH
Organism ID, Bacteria: NO GROWTH

## 2012-12-27 ENCOUNTER — Telehealth: Payer: Self-pay | Admitting: Adult Health

## 2012-12-27 MED ORDER — FLUCONAZOLE 150 MG PO TABS: ORAL_TABLET | ORAL | Status: DC

## 2012-12-27 NOTE — Telephone Encounter (Signed)
Pt was given Septra for UTI by Cyril Mourning, NP on 12/18/12. Pt states now has yeast infection, requesting Diflucan to be e-scribed.

## 2013-01-22 ENCOUNTER — Telehealth: Payer: Self-pay | Admitting: *Deleted

## 2013-01-22 DIAGNOSIS — R309 Painful micturition, unspecified: Secondary | ICD-10-CM

## 2013-01-22 MED ORDER — UROGESIC-BLUE 81.6 MG PO TABS: ORAL_TABLET | ORAL | Status: DC

## 2013-01-22 NOTE — Telephone Encounter (Signed)
Is having what I think is IC will call in meds til appt

## 2013-01-22 NOTE — Telephone Encounter (Signed)
Pt states that she thinks that her problems are coming from her bladder. Pt states that she has frequent urination, getting up 3-4 times during the night, pt states she has throbbing pains down there. Pt stated that she went 5 times in an hour the other day. Pt was advised to make an appointment with Dr. Despina Hidden to discuss bladder issues.

## 2013-01-31 ENCOUNTER — Encounter: Payer: Self-pay | Admitting: Obstetrics & Gynecology

## 2013-01-31 ENCOUNTER — Ambulatory Visit (INDEPENDENT_AMBULATORY_CARE_PROVIDER_SITE_OTHER): Payer: 59 | Admitting: Obstetrics & Gynecology

## 2013-01-31 VITALS — BP 120/80 | Ht 63.0 in | Wt 194.0 lb

## 2013-01-31 DIAGNOSIS — N3010 Interstitial cystitis (chronic) without hematuria: Secondary | ICD-10-CM

## 2013-01-31 DIAGNOSIS — R350 Frequency of micturition: Secondary | ICD-10-CM

## 2013-01-31 LAB — POCT URINALYSIS DIPSTICK
Blood, UA: NEGATIVE
Glucose, UA: NEGATIVE
KETONES UA: NEGATIVE
LEUKOCYTES UA: NEGATIVE
NITRITE UA: NEGATIVE
Protein, UA: NEGATIVE

## 2013-01-31 MED ORDER — PENTOSAN POLYSULFATE SODIUM 100 MG PO CAPS: ORAL_CAPSULE | ORAL | Status: DC

## 2013-01-31 NOTE — Progress Notes (Signed)
Patient ID: Marissa Trevino, female   DOB: [birth year], 38 y.o.   MRN: [id number] Chart reviewed 4 nagative urine cultures with dysuria dyspareunia frequnecy  Imp is IC  DMSO done today elmiron started myrbetriq started  Follow up 2 weeks for dmso  Past Medical History  Diagnosis Date  . PONV (postoperative nausea and vomiting)   . Herpes simplex without mention of complication   . BV (bacterial vaginosis) 05/02/2012  . Hematuria 12/18/2012  . UTI (lower urinary tract infection) 12/18/2012    Past Surgical History  Procedure Laterality Date  . Tubal ligation    . Cesarean section    . Tonsillectomy and adenoidectomy    . Cholecystectomy  16 yrsa ago    [facility]  . Dilitation & currettage/hystroscopy with thermachoice ablation N/A 03/09/2012    Procedure: DILATATION & CURETTAGE/HYSTEROSCOPY WITH THERMACHOICE ABLATION;  Surgeon: Florian Buff, MD;  Location: [facility];  Service: Gynecology;  Laterality: N/A;  D5W in -59ml,out 30ml, Total therapy time:31min 39sec Temperture 87 degree c.    OB History   Grav Para Term Preterm Abortions TAB SAB Ect Mult Living   3 3        3       Allergies  Allergen Reactions  . Codeine     History   Social History  . Marital Status: Single    Spouse Name: N/A    Number of Children: N/A  . Years of Education: N/A   Social History Main Topics  . Smoking status: Never Smoker   . Smokeless tobacco: Never Used  . Alcohol Use: No  . Drug Use: No  . Sexual Activity: Not Currently    Birth Control/ Protection: Surgical   Other Topics Concern  . None   Social History Narrative  . None    Family History  Problem Relation Age of Onset  . Hypertension Mother   . Heart disease Mother   . Hypertension Father   . Heart disease Father   . Heart attack Father   . Cancer Maternal Grandmother     breast  . Diabetes Paternal Grandmother   . Cancer Paternal Grandfather     lung

## 2013-02-14 ENCOUNTER — Encounter (INDEPENDENT_AMBULATORY_CARE_PROVIDER_SITE_OTHER): Payer: Self-pay

## 2013-02-14 ENCOUNTER — Ambulatory Visit (INDEPENDENT_AMBULATORY_CARE_PROVIDER_SITE_OTHER): Payer: 59 | Admitting: Obstetrics & Gynecology

## 2013-02-14 ENCOUNTER — Encounter: Payer: Self-pay | Admitting: Obstetrics & Gynecology

## 2013-02-14 VITALS — BP 120/80 | Wt 195.0 lb

## 2013-02-14 DIAGNOSIS — N3010 Interstitial cystitis (chronic) without hematuria: Secondary | ICD-10-CM

## 2013-02-14 MED ORDER — MIRABEGRON ER 50 MG PO TB24: 50.0000 mg | ORAL_TABLET | Freq: Every day | ORAL | Status: DC

## 2013-02-14 NOTE — Progress Notes (Signed)
Patient ID: Marissa Trevino, female   DOB: 05-Feb-1975, 38 y.o.   MRN: [id number] Pt had her 1st DMSO treatment 2 weeks ago. Held for 30 minutes, moderate pain first day, not as much since  2nd DMSO today  follow up in 3 weeks  Past Medical History  Diagnosis Date  . PONV (postoperative nausea and vomiting)   . Herpes simplex without mention of complication   . BV (bacterial vaginosis) 05/02/2012  . Hematuria 12/18/2012  . UTI (lower urinary tract infection) 12/18/2012    Past Surgical History  Procedure Laterality Date  . Tubal ligation    . Cesarean section    . Tonsillectomy and adenoidectomy    . Cholecystectomy  16 yrsa ago    [facility]  . Dilitation & currettage/hystroscopy with thermachoice ablation N/A 03/09/2012    Procedure: DILATATION & CURETTAGE/HYSTEROSCOPY WITH THERMACHOICE ABLATION;  Surgeon: Florian Buff, MD;  Location: [facility];  Service: Gynecology;  Laterality: N/A;  D5W in -76ml,out 8ml, Total therapy time:70min 39sec Temperture 87 degree c.    OB History   Grav Para Term Preterm Abortions TAB SAB Ect Mult Living   3 3        3       Allergies  Allergen Reactions  . Codeine     History   Social History  . Marital Status: Single    Spouse Name: N/A    Number of Children: N/A  . Years of Education: N/A   Social History Main Topics  . Smoking status: Never Smoker   . Smokeless tobacco: Never Used  . Alcohol Use: No  . Drug Use: No  . Sexual Activity: Not Currently    Birth Control/ Protection: Surgical   Other Topics Concern  . None   Social History Narrative  . None    Family History  Problem Relation Age of Onset  . Hypertension Mother   . Heart disease Mother   . Hypertension Father   . Heart disease Father   . Heart attack Father   . Cancer Maternal Grandmother     breast  . Diabetes Paternal Grandmother   . Cancer Paternal Grandfather     lung

## 2013-02-14 NOTE — Addendum Note (Signed)
Addended by: Florian Buff on: 02/14/2013 10:50 AM   Modules accepted: Orders

## 2013-03-07 ENCOUNTER — Encounter: Payer: Self-pay | Admitting: Obstetrics & Gynecology

## 2013-03-07 ENCOUNTER — Ambulatory Visit (INDEPENDENT_AMBULATORY_CARE_PROVIDER_SITE_OTHER): Payer: 59 | Admitting: Obstetrics & Gynecology

## 2013-03-07 VITALS — BP 130/80 | Wt 196.0 lb

## 2013-03-07 DIAGNOSIS — N3010 Interstitial cystitis (chronic) without hematuria: Secondary | ICD-10-CM

## 2013-03-07 NOTE — Progress Notes (Signed)
Patient ID: Marissa Trevino, female   DOB: 1975/02/20, 38 y.o.   MRN: [id number] Pt with IC s/p 2 previous dmso treatments Doing pretty good, better Will continue q 3 weeks for now  Past Medical History  Diagnosis Date  . PONV (postoperative nausea and vomiting)   . Herpes simplex without mention of complication   . BV (bacterial vaginosis) 05/02/2012  . Hematuria 12/18/2012  . UTI (lower urinary tract infection) 12/18/2012    Past Surgical History  Procedure Laterality Date  . Tubal ligation    . Cesarean section    . Tonsillectomy and adenoidectomy    . Cholecystectomy  16 yrsa ago    [facility]  . Dilitation & currettage/hystroscopy with thermachoice ablation N/A 03/09/2012    Procedure: DILATATION & CURETTAGE/HYSTEROSCOPY WITH THERMACHOICE ABLATION;  Surgeon: Florian Buff, MD;  Location: [facility];  Service: Gynecology;  Laterality: N/A;  D5W in -34ml,out 58ml, Total therapy time:24min 39sec Temperture 87 degree c.    OB History   Grav Para Term Preterm Abortions TAB SAB Ect Mult Living   3 3        3       Allergies  Allergen Reactions  . Codeine     History   Social History  . Marital Status: Single    Spouse Name: N/A    Number of Children: N/A  . Years of Education: N/A   Social History Main Topics  . Smoking status: Never Smoker   . Smokeless tobacco: Never Used  . Alcohol Use: No  . Drug Use: No  . Sexual Activity: Not Currently    Birth Control/ Protection: Surgical   Other Topics Concern  . None   Social History Narrative  . None    Family History  Problem Relation Age of Onset  . Hypertension Mother   . Heart disease Mother   . Hypertension Father   . Heart disease Father   . Heart attack Father   . Cancer Maternal Grandmother     breast  . Diabetes Paternal Grandmother   . Cancer Paternal Grandfather     lung

## 2013-03-28 ENCOUNTER — Ambulatory Visit (INDEPENDENT_AMBULATORY_CARE_PROVIDER_SITE_OTHER): Payer: 59 | Admitting: Obstetrics & Gynecology

## 2013-03-28 ENCOUNTER — Encounter: Payer: Self-pay | Admitting: Obstetrics & Gynecology

## 2013-03-28 VITALS — BP 132/82 | Ht 63.0 in | Wt 195.0 lb

## 2013-03-28 DIAGNOSIS — N3010 Interstitial cystitis (chronic) without hematuria: Secondary | ICD-10-CM

## 2013-03-28 MED ORDER — FLUCONAZOLE 150 MG PO TABS: 150.0000 mg | ORAL_TABLET | Freq: Once | ORAL | Status: DC

## 2013-03-28 NOTE — Progress Notes (Signed)
Patient ID: Marissa Trevino, female   DOB: [birth year], 38 y.o.   MRN: [id number] Patient ID: Marissa Trevino, female   DOB: Jul 27, 1975, 38 y.o.   MRN: [id number] Pt with IC s/p 2 previous dmso treatments Doing pretty good, better Will continue q 3 weeks for now  Past Medical History  Diagnosis Date  . PONV (postoperative nausea and vomiting)   . Herpes simplex without mention of complication   . BV (bacterial vaginosis) 05/02/2012  . Hematuria 12/18/2012  . UTI (lower urinary tract infection) 12/18/2012    Past Surgical History  Procedure Laterality Date  . Tubal ligation    . Cesarean section    . Tonsillectomy and adenoidectomy    . Cholecystectomy  16 yrsa ago    [facility]  . Dilitation & currettage/hystroscopy with thermachoice ablation N/A 03/09/2012    Procedure: DILATATION & CURETTAGE/HYSTEROSCOPY WITH THERMACHOICE ABLATION;  Surgeon: Florian Buff, MD;  Location: [facility];  Service: Gynecology;  Laterality: N/A;  D5W in -82ml,out 37ml, Total therapy time:63min 39sec Temperture 87 degree c.    OB History   Grav Para Term Preterm Abortions TAB SAB Ect Mult Living   3 3        3       Allergies  Allergen Reactions  . Codeine     History   Social History  . Marital Status: Single    Spouse Name: N/A    Number of Children: N/A  . Years of Education: N/A   Social History Main Topics  . Smoking status: Never Smoker   . Smokeless tobacco: Never Used  . Alcohol Use: No  . Drug Use: No  . Sexual Activity: Not Currently    Birth Control/ Protection: Surgical   Other Topics Concern  . None   Social History Narrative  . None    Family History  Problem Relation Age of Onset  . Hypertension Mother   . Heart disease Mother   . Hypertension Father   . Heart disease Father   . Heart attack Father   . Cancer Maternal Grandmother     breast  . Diabetes Paternal Grandmother   . Cancer Paternal Grandfather     lung

## 2013-04-07 ENCOUNTER — Emergency Department (HOSPITAL_COMMUNITY)
Admission: EM | Admit: 2013-04-07 | Discharge: 2013-04-07 | Disposition: A | Discharge status: Home / Self Care | Payer: 59 | Attending: Emergency Medicine | Admitting: Emergency Medicine

## 2013-04-07 ENCOUNTER — Emergency Department (HOSPITAL_COMMUNITY): Payer: 59

## 2013-04-07 ENCOUNTER — Encounter (HOSPITAL_COMMUNITY): Payer: Self-pay | Admitting: Emergency Medicine

## 2013-04-07 DIAGNOSIS — Y9239 Other specified sports and athletic area as the place of occurrence of the external cause: Secondary | ICD-10-CM | POA: Insufficient documentation

## 2013-04-07 DIAGNOSIS — Y92838 Other recreation area as the place of occurrence of the external cause: Secondary | ICD-10-CM

## 2013-04-07 DIAGNOSIS — S93409A Sprain of unspecified ligament of unspecified ankle, initial encounter: Secondary | ICD-10-CM | POA: Insufficient documentation

## 2013-04-07 DIAGNOSIS — Z8619 Personal history of other infectious and parasitic diseases: Secondary | ICD-10-CM | POA: Insufficient documentation

## 2013-04-07 DIAGNOSIS — Z8742 Personal history of other diseases of the female genital tract: Secondary | ICD-10-CM | POA: Insufficient documentation

## 2013-04-07 DIAGNOSIS — X500XXA Overexertion from strenuous movement or load, initial encounter: Secondary | ICD-10-CM | POA: Insufficient documentation

## 2013-04-07 DIAGNOSIS — Y939 Activity, unspecified: Secondary | ICD-10-CM | POA: Insufficient documentation

## 2013-04-07 DIAGNOSIS — Z87440 Personal history of urinary (tract) infections: Secondary | ICD-10-CM | POA: Insufficient documentation

## 2013-04-07 DIAGNOSIS — Z79899 Other long term (current) drug therapy: Secondary | ICD-10-CM | POA: Insufficient documentation

## 2013-04-07 NOTE — ED Notes (Signed)
Pt was in a jump house, landed incorrectly on R ankle. Edema and pain to R ankle and foot. Pt reports she had LOC due to pain. Denies hitting head.

## 2013-04-07 NOTE — ED Provider Notes (Signed)
Medical screening examination/treatment/procedure(s) were performed by non-physician practitioner and as supervising physician I was immediately available for consultation/collaboration.  Carmin Muskrat, MD 04/07/13 [phone]

## 2013-04-07 NOTE — Discharge Instructions (Signed)
you in a will your x-ray is negative for fracture or dislocation. your examination is consistent with an ankle sprain. Please apply ice, please elevate her ankle is much as possible. May use Tylenol or ibuprofen for  soreness. Please use the ankle stirrup splint for the next 7-10 days. Please see your primary physician, or return to the emergency department if not improving. Ankle Sprain An ankle sprain is an injury to the strong, fibrous tissues (ligaments) that hold the bones of your ankle joint together.  CAUSES An ankle sprain is usually caused by a fall or by twisting your ankle. Ankle sprains most commonly occur when you step on the outer edge of your foot, and your ankle turns inward. People who participate in sports are more prone to these types of injuries.  SYMPTOMS   Pain in your ankle. The pain may be present at rest or only when you are trying to stand or walk.  Swelling.  Bruising. Bruising may develop immediately or within 1 to 2 days after your injury.  Difficulty standing or walking, particularly when turning corners or changing directions. DIAGNOSIS  Your caregiver will ask you details about your injury and perform a physical exam of your ankle to determine if you have an ankle sprain. During the physical exam, your caregiver will press on and apply pressure to specific areas of your foot and ankle. Your caregiver will try to move your ankle in certain ways. An X-ray exam may be done to be sure a bone was not broken or a ligament did not separate from one of the bones in your ankle (avulsion fracture).  TREATMENT  Certain types of braces can help stabilize your ankle. Your caregiver can make a recommendation for this. Your caregiver may recommend the use of medicine for pain. If your sprain is severe, your caregiver may refer you to a surgeon who helps to restore function to parts of your skeletal system (orthopedist) or a physical therapist. [place] ice  to your injury for 1 2 days or as directed by your caregiver. Applying ice helps to reduce inflammation and pain.  Put ice in a plastic bag.  Place a towel between your skin and the bag.  Leave the ice on for 15-20 minutes at a time, every 2 hours while you are awake.  Only take over-the-counter or prescription medicines for pain, discomfort, or fever as directed by your caregiver.  Elevate your injured ankle above the level of your heart as much as possible for 2 3 days.  If your caregiver recommends crutches, use them as instructed. Gradually put weight on the affected ankle. Continue to use crutches or a cane until you can walk without feeling pain in your ankle.  If you have a plaster splint, wear the splint as directed by your caregiver. Do not rest it on anything harder than a pillow for the first 24 hours. Do not put weight on it. Do not get it wet. You may take it off to take a shower or bath.  You may have been given an elastic bandage to wear around your ankle to provide support. If the elastic bandage is too tight (you have numbness or tingling in your foot or your foot becomes cold and blue), adjust the bandage to make it comfortable.  If you have an air splint, you may blow more air into it or let air out to make it more comfortable. You may take your splint off at night  and before taking a shower or bath. Wiggle your toes in the splint several times per day to decrease swelling. SEEK MEDICAL CARE IF:   You have rapidly increasing bruising or swelling.  Your toes feel extremely cold or you lose feeling in your foot.  Your pain is not relieved with medicine. SEEK IMMEDIATE MEDICAL CARE IF:  Your toes are numb or blue.  You have severe pain that is increasing. MAKE SURE YOU:   Understand these instructions.  Will watch your condition.  Will get help right away if you are not doing well or get worse. Document Released: 01/10/2005 Document Revised: 10/05/2011 Document  Reviewed: 01/22/2011 [facility] Patient Information 2014 [place], [place].

## 2013-04-07 NOTE — ED Provider Notes (Addendum)
CSN: [id number]     Arrival date & time 04/07/13  1504 History   First MD Initiated Contact with Patient 04/07/13 1546     Chief Complaint  Patient presents with  . Ankle Pain     (Consider location/radiation/quality/duration/timing/severity/associated sxs/prior Treatment) Patient is a 38 y.o. female presenting with ankle pain. The history is provided by the patient.  Ankle Pain Location:  Ankle Time since incident:  2 hours Ankle location:  R ankle Pain details:    Quality:  Throbbing   Radiates to:  Does not radiate   Severity:  Moderate   Onset quality:  Sudden   Timing:  Constant   Progression:  Worsening Chronicity:  New Dislocation: no   Foreign body present:  No foreign bodies Prior injury to area:  No Relieved by:  Nothing Worsened by:  Bearing weight Ineffective treatments:  None tried Associated symptoms: decreased ROM   Associated symptoms: no back pain, no neck pain and no numbness   Risk factors: no recent illness     Past Medical History  Diagnosis Date  . PONV (postoperative nausea and vomiting)   . Herpes simplex without mention of complication   . BV (bacterial vaginosis) 05/02/2012  . Hematuria 12/18/2012  . UTI (lower urinary tract infection) 12/18/2012   Past Surgical History  Procedure Laterality Date  . Tubal ligation    . Cesarean section    . Tonsillectomy and adenoidectomy    . Cholecystectomy  16 yrsa ago    [facility]  . Dilitation & currettage/hystroscopy with thermachoice ablation N/A 03/09/2012    Procedure: DILATATION & CURETTAGE/HYSTEROSCOPY WITH THERMACHOICE ABLATION;  Surgeon: Florian Buff, MD;  Location: [facility];  Service: Gynecology;  Laterality: N/A;  D5W in -77ml,out 59ml, Total therapy time:24min 39sec Temperture 87 degree c.   Family History  Problem Relation Age of Onset  . Hypertension Mother   . Heart disease Mother   . Hypertension Father   . Heart disease Father   . Heart attack Father   . Cancer Maternal Grandmother    breast  . Diabetes Paternal Grandmother   . Cancer Paternal Grandfather     lung   History  Substance Use Topics  . Smoking status: Never Smoker   . Smokeless tobacco: Never Used  . Alcohol Use: No   OB History   Grav Para Term Preterm Abortions TAB SAB Ect Mult Living   3 3        3      Review of Systems  Constitutional: Negative for activity change.       All ROS Neg except as noted in HPI  HENT: Negative for nosebleeds.   Eyes: Negative for photophobia and discharge.  Respiratory: Negative for cough, shortness of breath and wheezing.   Cardiovascular: Negative for chest pain and palpitations.  Gastrointestinal: Negative for abdominal pain and blood in stool.  Genitourinary: Negative for dysuria, frequency and hematuria.  Musculoskeletal: Negative for arthralgias, back pain and neck pain.  Skin: Negative.   Neurological: Negative for dizziness, seizures and speech difficulty.  Psychiatric/Behavioral: Negative for hallucinations and confusion.      Allergies  Codeine  Home Medications   Current Outpatient Rx  Name  Route  Sig  Dispense  Refill  . Ascorbic Acid (VITAMIN C) 1000 MG tablet   Oral   Take 1,000 mg by mouth daily.         . cetirizine (ZYRTEC) 10 MG tablet   Oral   Take 10 mg by  mouth daily.         . Cyanocobalamin (VITAMIN B 12 PO)   Oral   Take 1 tablet by mouth daily.         . Methen-Hyosc-Meth Blue-Na Phos (UROGESIC-BLUE) 81.6 MG TABS      Take 1 qid x 2 weeks   56 tablet   0   . mirabegron ER (MYRBETRIQ) 50 MG TB24 tablet   Oral   Take 1 tablet (50 mg total) by mouth daily.   30 tablet   11   . pentosan polysulfate (ELMIRON) 100 MG capsule   Oral   Take 200 mg by mouth 2 (two) times daily.          BP 132/78  Pulse 91  Temp(Src) 98.3 F (36.8 C) (Oral)  Resp 16  Ht 5\' 3"  (1.6 m)  Wt 197 lb (89.359 kg)  BMI 34.91 kg/m2  SpO2 98%  LMP 04/04/2013 Physical Exam  Nursing note and vitals reviewed. Constitutional:  She is oriented to person, place, and time. She appears well-developed and well-nourished.  Non-toxic appearance.  HENT:  Head: Normocephalic.  Right Ear: Tympanic membrane and external ear normal.  Left Ear: Tympanic membrane and external ear normal.  Eyes: EOM and lids are normal. Pupils are equal, round, and reactive to light.  Neck: Normal range of motion. Neck supple. Carotid bruit is not present.  Cardiovascular: Normal rate, regular rhythm, normal heart sounds, intact distal pulses and normal pulses.   Pulmonary/Chest: Breath sounds normal. No respiratory distress.  Abdominal: Soft. Bowel sounds are normal. There is no tenderness. There is no guarding.  Musculoskeletal: Normal range of motion.  FROM of the right hip and knee. There is pain and swelling of the lateral malleolus. The dorsalis pedis pulses 2+. The Achilles tendon is intact.   Lymphadenopathy:       Head (right side): No submandibular adenopathy present.       Head (left side): No submandibular adenopathy present.    She has no cervical adenopathy.  Neurological: She is alert and oriented to person, place, and time. She has normal strength. No cranial nerve deficit or sensory deficit. She exhibits normal muscle tone. Coordination normal.  No acute neurologic deficits. Speech is clear and understandable.  Skin: Skin is warm and dry.  Psychiatric: She has a normal mood and affect. Her speech is normal.    ED Course  Procedures (including critical care time) Labs Review Labs Reviewed - No data to display Imaging Review Dg Ankle Complete Right  04/07/2013   CLINICAL DATA:  Trauma and pain.  EXAM: RIGHT ANKLE - COMPLETE 3+ VIEW  COMPARISON:  DG FOOT COMPLETE*R* dated 04/07/2013  FINDINGS: Mild lateral malleolar and hindfoot soft tissue swelling. No acute fracture or dislocation. Small calcaneal spur. Base of fifth metatarsal and talar dome intact.  IMPRESSION: Lateral soft tissue swelling only.   Electronically Signed   By:  Abigail Miyamoto M.D.   On: 04/07/2013 15:56   Dg Foot Complete Right  04/07/2013   CLINICAL DATA:  Trauma 1 day ago with proximal foot pain and ankle swelling.  EXAM: RIGHT FOOT COMPLETE - 3+ VIEW  COMPARISON:  None.  FINDINGS: Small calcaneal spur. No acute fracture or dislocation. No definite soft tissue swelling.  IMPRESSION: No acute osseous abnormality.   Electronically Signed   By: Abigail Miyamoto M.D.   On: 04/07/2013 15:56     EKG Interpretation None      MDM X-ray of the right ankle  is negative for fracture, but there is lateral soft tissue swelling.  Patient is given the results of the x-rays. Patient fitted with an ankle stirrup splint. Excuse for work given for her to return on Wednesday, March 18.   Final diagnoses:  None    **I have reviewed nursing notes, vital signs, and all appropriate lab and imaging results for this patient.Lenox Ahr, PA-C 04/07/13 [place], PA-C 04/16/13 1626

## 2013-04-16 NOTE — ED Provider Notes (Signed)
  Medical screening examination/treatment/procedure(s) were performed by non-physician practitioner and as supervising physician I was immediately available for consultation/collaboration.   EKG Interpretation None         Carmin Muskrat, MD 04/16/13 [phone]

## 2013-04-25 ENCOUNTER — Ambulatory Visit (INDEPENDENT_AMBULATORY_CARE_PROVIDER_SITE_OTHER): Payer: 59 | Admitting: Obstetrics & Gynecology

## 2013-04-25 ENCOUNTER — Encounter: Payer: Self-pay | Admitting: Obstetrics & Gynecology

## 2013-04-25 VITALS — BP 130/80 | Wt 194.0 lb

## 2013-04-25 DIAGNOSIS — N3010 Interstitial cystitis (chronic) without hematuria: Secondary | ICD-10-CM

## 2013-04-25 NOTE — Progress Notes (Signed)
Patient ID: Marissa Trevino, female   DOB: March 25, 1975, 38 y.o.   MRN: [id number] Patient ID: Marissa Trevino, female   DOB: May 26, 1975, 38 y.o.   MRN: [id number] Patient ID: Marissa Trevino, female   DOB: 1975/09/10, 38 y.o.   MRN: [id number] Pt with IC s/p 2 previous dmso treatments Doing pretty good, better Will continue 4 weeks for now  Past Medical History  Diagnosis Date  . PONV (postoperative nausea and vomiting)   . Herpes simplex without mention of complication   . BV (bacterial vaginosis) 05/02/2012  . Hematuria 12/18/2012  . UTI (lower urinary tract infection) 12/18/2012    Past Surgical History  Procedure Laterality Date  . Tubal ligation    . Cesarean section    . Tonsillectomy and adenoidectomy    . Cholecystectomy  16 yrsa ago    [facility]  . Dilitation & currettage/hystroscopy with thermachoice ablation N/A 03/09/2012    Procedure: DILATATION & CURETTAGE/HYSTEROSCOPY WITH THERMACHOICE ABLATION;  Surgeon: Florian Buff, MD;  Location: [facility];  Service: Gynecology;  Laterality: N/A;  D5W in -29ml,out 17ml, Total therapy time:4min 39sec Temperture 87 degree c.    OB History   Grav Para Term Preterm Abortions TAB SAB Ect Mult Living   3 3        3       Allergies  Allergen Reactions  . Codeine     History   Social History  . Marital Status: Single    Spouse Name: N/A    Number of Children: N/A  . Years of Education: N/A   Social History Main Topics  . Smoking status: Never Smoker   . Smokeless tobacco: Never Used  . Alcohol Use: No  . Drug Use: No  . Sexual Activity: Not Currently    Birth Control/ Protection: Surgical   Other Topics Concern  . None   Social History Narrative  . None    Family History  Problem Relation Age of Onset  . Hypertension Mother   . Heart disease Mother   . Hypertension Father   . Heart disease Father   . Heart attack Father   . Cancer Maternal Grandmother     breast  . Diabetes Paternal Grandmother   . Cancer Paternal Grandfather      lung

## 2013-05-20 DIAGNOSIS — Z0289 Encounter for other administrative examinations: Secondary | ICD-10-CM

## 2013-05-23 ENCOUNTER — Encounter: Payer: Self-pay | Admitting: Obstetrics & Gynecology

## 2013-05-23 ENCOUNTER — Other Ambulatory Visit (HOSPITAL_COMMUNITY)
Admission: RE | Admit: 2013-05-23 | Discharge: 2013-05-23 | Disposition: A | Discharge status: Home / Self Care | Payer: 59 | Source: Ambulatory Visit | Attending: Obstetrics & Gynecology | Admitting: Obstetrics & Gynecology

## 2013-05-23 ENCOUNTER — Ambulatory Visit (INDEPENDENT_AMBULATORY_CARE_PROVIDER_SITE_OTHER): Payer: 59 | Admitting: Obstetrics & Gynecology

## 2013-05-23 VITALS — BP 120/80 | Ht 63.2 in | Wt 196.0 lb

## 2013-05-23 DIAGNOSIS — Z124 Encounter for screening for malignant neoplasm of cervix: Secondary | ICD-10-CM | POA: Insufficient documentation

## 2013-05-23 DIAGNOSIS — Z1151 Encounter for screening for human papillomavirus (HPV): Secondary | ICD-10-CM | POA: Insufficient documentation

## 2013-05-23 DIAGNOSIS — Z01419 Encounter for gynecological examination (general) (routine) without abnormal findings: Secondary | ICD-10-CM

## 2013-05-23 NOTE — Addendum Note (Signed)
Addended by: Doyne Keel on: 05/23/2013 10:11 AM   Modules accepted: Orders

## 2013-05-23 NOTE — Progress Notes (Signed)
Patient ID: Marissa Trevino, female   DOB: Nov 21, 1975, 38 y.o.   MRN: [id number] Subjective:     Marissa Trevino is a 38 y.o. female here for a routine exam.  Patient's last menstrual period was 04/28/2013. G3P3 Birth Control Method:  BTL Menstrual Calendar(currently): ablation  Current complaints: none.   Current acute medical issues:  IC   Recent Gynecologic History Patient's last menstrual period was 04/28/2013. Last Pap: 2014,  normal Last mammogram: na,    Past Medical History  Diagnosis Date  . PONV (postoperative nausea and vomiting)   . Herpes simplex without mention of complication   . BV (bacterial vaginosis) 05/02/2012  . Hematuria 12/18/2012  . UTI (lower urinary tract infection) 12/18/2012    Past Surgical History  Procedure Laterality Date  . Tubal ligation    . Cesarean section    . Tonsillectomy and adenoidectomy    . Cholecystectomy  16 yrsa ago    [facility]  . Dilitation & currettage/hystroscopy with thermachoice ablation N/A 03/09/2012    Procedure: DILATATION & CURETTAGE/HYSTEROSCOPY WITH THERMACHOICE ABLATION;  Surgeon: Florian Buff, MD;  Location: [facility];  Service: Gynecology;  Laterality: N/A;  D5W in -13ml,out 50ml, Total therapy time:76min 39sec Temperture 87 degree c.    OB History   Grav Para Term Preterm Abortions TAB SAB Ect Mult Living   3 3        3       History   Social History  . Marital Status: Single    Spouse Name: N/A    Number of Children: N/A  . Years of Education: N/A   Social History Main Topics  . Smoking status: Never Smoker   . Smokeless tobacco: Never Used  . Alcohol Use: No  . Drug Use: No  . Sexual Activity: Not Currently    Birth Control/ Protection: Surgical   Other Topics Concern  . None   Social History Narrative  . None    Family History  Problem Relation Age of Onset  . Hypertension Mother   . Heart disease Mother   . Hypertension Father   . Heart disease Father   . Heart attack Father   . Lung cancer Father    . Cancer Maternal Grandmother     breast  . Diabetes Paternal Grandmother   . Cancer Paternal Grandfather     lung     Review of Systems  Review of Systems  Constitutional: Negative for fever, chills, weight loss, malaise/fatigue and diaphoresis.  HENT: Negative for hearing loss, ear pain, nosebleeds, congestion, sore throat, neck pain, tinnitus and ear discharge.   Eyes: Negative for blurred vision, double vision, photophobia, pain, discharge and redness.  Respiratory: Negative for cough, hemoptysis, sputum production, shortness of breath, wheezing and stridor.   Cardiovascular: Negative for chest pain, palpitations, orthopnea, claudication, leg swelling and PND.  Gastrointestinal: negative for abdominal pain. Negative for heartburn, nausea, vomiting, diarrhea, constipation, blood in stool and melena.  Genitourinary: Negative for dysuria, urgency, frequency, hematuria and flank pain.  Musculoskeletal: Negative for myalgias, back pain, joint pain and falls.  Skin: Negative for itching and rash.  Neurological: Negative for dizziness, tingling, tremors, sensory change, speech change, focal weakness, seizures, loss of consciousness, weakness and headaches.  Endo/Heme/Allergies: Negative for environmental allergies and polydipsia. Does not bruise/bleed easily.  Psychiatric/Behavioral: Negative for depression, suicidal ideas, hallucinations, memory loss and substance abuse. The patient is not nervous/anxious and does not have insomnia.        Objective:  Physical Exam  Vitals reviewed. Constitutional: She is oriented to person, place, and time. She appears well-developed and well-nourished.  HENT:  Head: Normocephalic and atraumatic.        Right Ear: External ear normal.  Left Ear: External ear normal.  Nose: Nose normal.  Mouth/Throat: Oropharynx is clear and moist.  Eyes: Conjunctivae and EOM are normal. Pupils are equal, round, and reactive to light. Right eye exhibits no  discharge. Left eye exhibits no discharge. No scleral icterus.  Neck: Normal range of motion. Neck supple. No tracheal deviation present. No thyromegaly present.  Cardiovascular: Normal rate, regular rhythm, normal heart sounds and intact distal pulses.  Exam reveals no gallop and no friction rub.   No murmur heard. Respiratory: Effort normal and breath sounds normal. No respiratory distress. She has no wheezes. She has no rales. She exhibits no tenderness.  GI: Soft. Bowel sounds are normal. She exhibits no distension and no mass. There is no tenderness. There is no rebound and no guarding.  Genitourinary:  Breasts no masses skin changes or nipple changes bilaterally      Vulva is normal without lesions Vagina is pink moist without discharge Cervix normal in appearance and pap is done Uterus is normal size shape and contour Adnexa is negative with normal sized ovaries   Musculoskeletal: Normal range of motion. She exhibits no edema and no tenderness.  Neurological: She is alert and oriented to person, place, and time. She has normal reflexes. She displays normal reflexes. No cranial nerve deficit. She exhibits normal muscle tone. Coordination normal.  Skin: Skin is warm and dry. No rash noted. No erythema. No pallor.  Psychiatric: She has a normal mood and affect. Her behavior is normal. Judgment and thought content normal.       Assessment:    Healthy female exam.    Plan:    Follow up in: 1 month.

## 2013-06-20 ENCOUNTER — Encounter: Payer: Self-pay | Admitting: Obstetrics & Gynecology

## 2013-06-20 ENCOUNTER — Ambulatory Visit (INDEPENDENT_AMBULATORY_CARE_PROVIDER_SITE_OTHER): Payer: 59 | Admitting: Obstetrics & Gynecology

## 2013-06-20 VITALS — BP 120/80 | Wt 196.0 lb

## 2013-06-20 DIAGNOSIS — N3010 Interstitial cystitis (chronic) without hematuria: Secondary | ICD-10-CM

## 2013-06-20 NOTE — Progress Notes (Signed)
Patient ID: Marissa Trevino, female   DOB: 07-17-1975, 38 y.o.   MRN: [id number] Pt with IC  previous dmso treatments Doing pretty good, better Will continue 6 weeks for now  No burning with urination, frequency or urgency No nausea, vomiting or diarrhea Nor fever chills or other constitutional symptoms  Exam Normal EFG no discharge  50 cc DMSO instilled  Past Medical History  Diagnosis Date  . PONV (postoperative nausea and vomiting)   . Herpes simplex without mention of complication   . BV (bacterial vaginosis) 05/02/2012  . Hematuria 12/18/2012  . UTI (lower urinary tract infection) 12/18/2012    Past Surgical History  Procedure Laterality Date  . Tubal ligation    . Cesarean section    . Tonsillectomy and adenoidectomy    . Cholecystectomy  16 yrsa ago    [facility]  . Dilitation & currettage/hystroscopy with thermachoice ablation N/A 03/09/2012    Procedure: DILATATION & CURETTAGE/HYSTEROSCOPY WITH THERMACHOICE ABLATION;  Surgeon: Florian Buff, MD;  Location: [facility];  Service: Gynecology;  Laterality: N/A;  D5W in -10ml,out 5ml, Total therapy time:80min 39sec Temperture 87 degree c.    OB History   Grav Para Term Preterm Abortions TAB SAB Ect Mult Living   3 3        3       Allergies  Allergen Reactions  . Codeine     History   Social History  . Marital Status: Single    Spouse Name: N/A    Number of Children: N/A  . Years of Education: N/A   Social History Main Topics  . Smoking status: Never Smoker   . Smokeless tobacco: Never Used  . Alcohol Use: No  . Drug Use: No  . Sexual Activity: Not Currently    Birth Control/ Protection: Surgical   Other Topics Concern  . None   Social History Narrative  . None    Family History  Problem Relation Age of Onset  . Hypertension Mother   . Heart disease Mother   . Hypertension Father   . Heart disease Father   . Heart attack Father   . Lung cancer Father   . Cancer Maternal Grandmother     breast  .  Diabetes Paternal Grandmother   . Cancer Paternal Grandfather     lung

## 2013-08-01 ENCOUNTER — Ambulatory Visit: Payer: 59 | Admitting: Obstetrics & Gynecology

## 2013-08-15 ENCOUNTER — Ambulatory Visit (INDEPENDENT_AMBULATORY_CARE_PROVIDER_SITE_OTHER): Payer: 59 | Admitting: Obstetrics & Gynecology

## 2013-08-15 ENCOUNTER — Encounter: Payer: Self-pay | Admitting: Obstetrics & Gynecology

## 2013-08-15 VITALS — BP 112/68 | Ht 63.0 in | Wt 199.5 lb

## 2013-08-15 DIAGNOSIS — N3010 Interstitial cystitis (chronic) without hematuria: Secondary | ICD-10-CM

## 2013-08-15 NOTE — Progress Notes (Signed)
Patient ID: Marissa Trevino, female   DOB: Nov 02, 1975, 38 y.o.   MRN: [id number] Patient ID: Marissa Trevino, female   DOB: February 11, 1975, 38 y.o.   MRN: [id number] Pt with IC  previous dmso treatments Doing pretty good, better Will continue 8 weeks for now  No burning with urination, frequency or urgency No nausea, vomiting or diarrhea Nor fever chills or other constitutional symptoms  Exam Normal EFG no discharge  50 cc DMSO instilled  Past Medical History  Diagnosis Date  . PONV (postoperative nausea and vomiting)   . Herpes simplex without mention of complication   . BV (bacterial vaginosis) 05/02/2012  . Hematuria 12/18/2012  . UTI (lower urinary tract infection) 12/18/2012    Past Surgical History  Procedure Laterality Date  . Tubal ligation    . Cesarean section    . Tonsillectomy and adenoidectomy    . Cholecystectomy  16 yrsa ago    [facility]  . Dilitation & currettage/hystroscopy with thermachoice ablation N/A 03/09/2012    Procedure: DILATATION & CURETTAGE/HYSTEROSCOPY WITH THERMACHOICE ABLATION;  Surgeon: Florian Buff, MD;  Location: [facility];  Service: Gynecology;  Laterality: N/A;  D5W in -30ml,out 37ml, Total therapy time:58min 39sec Temperture 87 degree c.    OB History   Grav Para Term Preterm Abortions TAB SAB Ect Mult Living   3 3        3       Allergies  Allergen Reactions  . Codeine     History   Social History  . Marital Status: Single    Spouse Name: N/A    Number of Children: N/A  . Years of Education: N/A   Social History Main Topics  . Smoking status: Never Smoker   . Smokeless tobacco: Never Used  . Alcohol Use: No  . Drug Use: No  . Sexual Activity: Not Currently    Birth Control/ Protection: Surgical   Other Topics Concern  . None   Social History Narrative  . None    Family History  Problem Relation Age of Onset  . Hypertension Mother   . Heart disease Mother   . Hypertension Father   . Heart disease Father   . Heart attack Father    . Lung cancer Father   . Cancer Maternal Grandmother     breast  . Diabetes Paternal Grandmother   . Cancer Paternal Grandfather     lung

## 2013-10-17 ENCOUNTER — Ambulatory Visit: Payer: 59 | Admitting: Obstetrics & Gynecology

## 2013-10-24 ENCOUNTER — Ambulatory Visit (INDEPENDENT_AMBULATORY_CARE_PROVIDER_SITE_OTHER): Payer: 59 | Admitting: Obstetrics & Gynecology

## 2013-10-24 ENCOUNTER — Encounter: Payer: Self-pay | Admitting: Obstetrics & Gynecology

## 2013-10-24 VITALS — BP 122/82 | Ht 63.0 in | Wt 194.0 lb

## 2013-10-24 DIAGNOSIS — N3010 Interstitial cystitis (chronic) without hematuria: Secondary | ICD-10-CM

## 2013-10-24 NOTE — Progress Notes (Signed)
Patient ID: Marissa Trevino, female   DOB: 1975/08/14, 38 y.o.   MRN: [id number] Patient has chronic interstitial cystitis which we have been managing with elmiron and DMSO now for quite some time, I first diagnosed her in January 2015 she's been having regular DMSO treatments since that time.  She comes in today with her last treatment having been July 23. She states she is doing fine  Blood pressure 122/82, height 5\' 3"  (1.6 m), weight 194 lb (87.998 kg), last menstrual period 10/15/2013.   No burning with urination, frequency or urgency No nausea, vomiting or diarrhea Nor fever chills or other constitutional symptoms  NEFG Vagina pink moist no discharge  Urethra prepped with Betadine 10 French catheters used and her bladder drained 50 cc of DMSO is instilled into the bladder Patient tolerated problems  She is reminded to keep it in her bladder as long as she can  Assessment Chronic interstitial cystitis, improving now on maintenance  Plan Continue elmiron and dietary changes followup in 3 months for next DMSO treatments

## 2013-11-25 ENCOUNTER — Encounter: Payer: Self-pay | Admitting: Obstetrics & Gynecology

## 2014-01-15 ENCOUNTER — Telehealth: Payer: Self-pay | Admitting: Obstetrics & Gynecology

## 2014-01-15 MED ORDER — FLUCONAZOLE 150 MG PO TABS: 150.0000 mg | ORAL_TABLET | Freq: Once | ORAL | Status: DC

## 2014-01-27 ENCOUNTER — Encounter: Payer: Self-pay | Admitting: Obstetrics & Gynecology

## 2014-01-27 ENCOUNTER — Ambulatory Visit: Payer: 59 | Admitting: Obstetrics & Gynecology

## 2014-01-28 ENCOUNTER — Encounter: Payer: Self-pay | Admitting: *Deleted

## 2014-02-03 ENCOUNTER — Other Ambulatory Visit: Payer: Self-pay | Admitting: Obstetrics & Gynecology

## 2014-04-29 ENCOUNTER — Other Ambulatory Visit (HOSPITAL_COMMUNITY): Payer: Self-pay | Admitting: Internal Medicine

## 2014-04-29 DIAGNOSIS — K222 Esophageal obstruction: Secondary | ICD-10-CM

## 2014-05-06 ENCOUNTER — Ambulatory Visit (HOSPITAL_COMMUNITY)
Admission: RE | Admit: 2014-05-06 | Discharge: 2014-05-06 | Disposition: A | Discharge status: Home / Self Care | Payer: 59 | Source: Ambulatory Visit | Attending: Internal Medicine | Admitting: Internal Medicine

## 2014-05-06 DIAGNOSIS — K222 Esophageal obstruction: Secondary | ICD-10-CM | POA: Insufficient documentation

## 2014-05-08 ENCOUNTER — Encounter: Payer: Self-pay | Admitting: Internal Medicine

## 2014-05-29 ENCOUNTER — Ambulatory Visit: Payer: 59 | Admitting: Gastroenterology

## 2014-06-05 ENCOUNTER — Ambulatory Visit (INDEPENDENT_AMBULATORY_CARE_PROVIDER_SITE_OTHER): Payer: 59 | Admitting: Gastroenterology

## 2014-06-05 ENCOUNTER — Encounter: Payer: Self-pay | Admitting: Gastroenterology

## 2014-06-05 VITALS — BP 120/78 | HR 74 | Temp 97.3°F | Ht 63.0 in | Wt 195.0 lb

## 2014-06-05 DIAGNOSIS — K5909 Other constipation: Secondary | ICD-10-CM

## 2014-06-05 DIAGNOSIS — K219 Gastro-esophageal reflux disease without esophagitis: Secondary | ICD-10-CM | POA: Diagnosis not present

## 2014-06-05 MED ORDER — LINACLOTIDE 145 MCG PO CAPS: 145.0000 ug | ORAL_CAPSULE | Freq: Every day | ORAL | Status: DC

## 2014-06-05 NOTE — Patient Instructions (Signed)
Continue Prilosec once daily.   Start Linzess 1 capsule each morning on an empty stomach, at least 30 minutes before breakfast. This is for constipation. Call me if you have any persistent diarrhea or other concerns.  No need for endoscopy right now unless your symptoms recur.   I would like to see you back in 3 months!

## 2014-06-05 NOTE — Progress Notes (Signed)
Referring Provider: Delphina Cahill, MD Primary Care Physician:  Delphina Cahill, MD Primary Gastroenterologist:  Dr. Oneida Alar   Chief Complaint  Patient presents with  . Dysphagia    HPI:   Marissa Trevino is a 39 y.o. female presenting today at the request of Delphina Cahill, MD secondary to dysphagia. Recently had BPE 4/12 without evidence of mass or stricture, smooth appearance of esophagus, normal exam.   Notes 2 episodes of choking in the past several months. Placed on Prilosec, which has helped significantly.   Had liquid and solid food dysphagia (with chicken and salad), felt water not going down and had to throw up. All resolved since starting Prilosec. No reflux symptoms. No abdominal pain. Notes chronic constipation, occasionally has to take something. May go up to a week at a time. Takes stool softener or dulcolax once or twice a week. No rectal bleeding. Happened with chicken and salad.   Past Medical History  Diagnosis Date  . PONV (postoperative nausea and vomiting)   . Herpes simplex without mention of complication   . BV (bacterial vaginosis) 05/02/2012  . Hematuria 12/18/2012  . UTI (lower urinary tract infection) 12/18/2012  . Interstitial cystitis     Past Surgical History  Procedure Laterality Date  . Tubal ligation    . Cesarean section    . Tonsillectomy and adenoidectomy    . Cholecystectomy  16 years ago    [facility]  . Dilitation & currettage/hystroscopy with thermachoice ablation N/A 03/09/2012    Procedure: DILATATION & CURETTAGE/HYSTEROSCOPY WITH THERMACHOICE ABLATION;  Surgeon: Florian Buff, MD;  Location: [facility];  Service: Gynecology;  Laterality: N/A;  D5W in -72ml,out 76ml, Total therapy time:27min 39sec     Current Outpatient Prescriptions  Medication Sig Dispense Refill  . Ascorbic Acid (VITAMIN C) 1000 MG tablet Take 1,000 mg by mouth daily.    . cetirizine (ZYRTEC) 10 MG tablet Take 10 mg by mouth daily.    . Cyanocobalamin (VITAMIN B 12 PO) Take 1 tablet  by mouth daily.    Marland Kitchen ELMIRON 100 MG capsule TAKE 2 CAPSULES BY MOUTH TWICE DAILY 120 capsule 11  . mirabegron ER (MYRBETRIQ) 50 MG TB24 tablet Take 1 tablet (50 mg total) by mouth daily. 30 tablet 11  . omeprazole (PRILOSEC) 20 MG capsule Take 20 mg by mouth daily.    . pentosan polysulfate (ELMIRON) 100 MG capsule Take 200 mg by mouth 2 (two) times daily.     No current facility-administered medications for this visit.    Allergies as of 06/05/2014 - Review Complete 06/05/2014  Allergen Reaction Noted  . Codeine  02/27/2006    Family History  Problem Relation Age of Onset  . Hypertension Mother   . Heart disease Mother   . Hypertension Father   . Heart disease Father   . Heart attack Father   . Lung cancer Father   . Cancer Maternal Grandmother     breast  . Diabetes Paternal Grandmother   . Cancer Paternal Grandfather     lung  . Colon cancer Neg Hx     History   Social History  . Marital Status: Single    Spouse Name: N/A  . Number of Children: N/A  . Years of Education: N/A   Occupational History  . nurse tech     the [employer]   Social History Main Topics  . Smoking status: Never Smoker   . Smokeless tobacco: Never Used  . Alcohol  Use: No  . Drug Use: No  . Sexual Activity: Yes    Birth Control/ Protection: Surgical   Other Topics Concern  . Not on file   Social History Narrative    Review of Systems: Gen: Denies any fever, chills, loss of appetite, fatigue, weight loss. CV: Denies chest pain, heart palpitations, syncope, peripheral edema. Resp: Denies shortness of breath with rest, cough, wheezing GI: see HPI GU : Denies urinary burning, urinary frequency, urinary incontinence.  MS: +joint pain, hips  Derm: Denies rash, itching, dry skin Psych: Denies depression, anxiety, confusion or memory loss  Heme: Denies bruising, bleeding, and enlarged lymph nodes.  Physical Exam: BP 120/78 mmHg  Pulse 74  Temp(Src) 97.3 F (36.3 C)  Ht 5\' 3"  (1.6  m)  Wt 195 lb (88.451 kg)  BMI 34.55 kg/m2  LMP 05/25/2014 General:   Alert and oriented. Well-developed, well-nourished, pleasant and cooperative. Head:  Normocephalic and atraumatic. Eyes:  Conjunctiva pink, sclera clear, no icterus.   Conjunctiva pink. Ears:  Normal auditory acuity. Nose:  No deformity, discharge,  or lesions. Mouth:  No deformity or lesions, mucosa pink and moist.  Lungs:  Clear to auscultation bilaterally, without wheezing, rales, or rhonchi.  Heart:  S1, S2 present without murmurs noted.  Abdomen:  +BS, soft, non-tender and non-distended. Without mass or HSM. No rebound or guarding. No hernias noted. Rectal:  Deferred  Msk:  Symmetrical without gross deformities. Normal posture. Extremities:  Without clubbing or edema. Neurologic:  Alert and  oriented x4;  grossly normal neurologically. Skin:  Intact, warm and dry without significant lesions or rashes Psych:  Alert and cooperative. Normal mood and affect.

## 2014-06-05 NOTE — Assessment & Plan Note (Signed)
Chronic. Trial of Linzess 145 mcg daily. Return in 3 months.

## 2014-06-05 NOTE — Assessment & Plan Note (Signed)
39 year old female with several recent episodes of solid/liquid dysphagia and "choking", with resolution after starting Prilosec once daily. BPE without abnormal findings. Likely symptoms secondary to uncontrolled GERD. No alarm signs currently. Would continue Prilosec daily and pursue EGD with dilation if any recurrent symptoms. Discussed at length with patient. Return in 3 months. Patient to call if any recurrent symptoms in the interim.

## 2014-06-05 NOTE — Progress Notes (Signed)
cc'ed to pcp °

## 2014-09-07 ENCOUNTER — Emergency Department (HOSPITAL_COMMUNITY)
Admission: EM | Admit: 2014-09-07 | Discharge: 2014-09-07 | Disposition: A | Discharge status: Home / Self Care | Payer: 59 | Attending: Emergency Medicine | Admitting: Emergency Medicine

## 2014-09-07 ENCOUNTER — Encounter (HOSPITAL_COMMUNITY): Payer: Self-pay | Admitting: Emergency Medicine

## 2014-09-07 DIAGNOSIS — Z87440 Personal history of urinary (tract) infections: Secondary | ICD-10-CM | POA: Insufficient documentation

## 2014-09-07 DIAGNOSIS — Z5189 Encounter for other specified aftercare: Secondary | ICD-10-CM

## 2014-09-07 DIAGNOSIS — W25XXXA Contact with sharp glass, initial encounter: Secondary | ICD-10-CM | POA: Diagnosis not present

## 2014-09-07 DIAGNOSIS — Z8619 Personal history of other infectious and parasitic diseases: Secondary | ICD-10-CM | POA: Insufficient documentation

## 2014-09-07 DIAGNOSIS — Z87448 Personal history of other diseases of urinary system: Secondary | ICD-10-CM | POA: Diagnosis not present

## 2014-09-07 DIAGNOSIS — Y92512 Supermarket, store or market as the place of occurrence of the external cause: Secondary | ICD-10-CM | POA: Insufficient documentation

## 2014-09-07 DIAGNOSIS — Z8742 Personal history of other diseases of the female genital tract: Secondary | ICD-10-CM | POA: Diagnosis not present

## 2014-09-07 DIAGNOSIS — Z23 Encounter for immunization: Secondary | ICD-10-CM | POA: Insufficient documentation

## 2014-09-07 DIAGNOSIS — R112 Nausea with vomiting, unspecified: Secondary | ICD-10-CM | POA: Insufficient documentation

## 2014-09-07 DIAGNOSIS — Z79899 Other long term (current) drug therapy: Secondary | ICD-10-CM | POA: Insufficient documentation

## 2014-09-07 DIAGNOSIS — Y998 Other external cause status: Secondary | ICD-10-CM | POA: Diagnosis not present

## 2014-09-07 DIAGNOSIS — S61219A Laceration without foreign body of unspecified finger without damage to nail, initial encounter: Secondary | ICD-10-CM

## 2014-09-07 DIAGNOSIS — Y9389 Activity, other specified: Secondary | ICD-10-CM | POA: Insufficient documentation

## 2014-09-07 DIAGNOSIS — R42 Dizziness and giddiness: Secondary | ICD-10-CM | POA: Diagnosis present

## 2014-09-07 DIAGNOSIS — S61215A Laceration without foreign body of left ring finger without damage to nail, initial encounter: Secondary | ICD-10-CM | POA: Diagnosis not present

## 2014-09-07 DIAGNOSIS — Z4801 Encounter for change or removal of surgical wound dressing: Secondary | ICD-10-CM | POA: Insufficient documentation

## 2014-09-07 DIAGNOSIS — R200 Anesthesia of skin: Secondary | ICD-10-CM | POA: Diagnosis not present

## 2014-09-07 MED ORDER — TETANUS-DIPHTH-ACELL PERTUSSIS 5-2.5-18.5 LF-MCG/0.5 IM SUSP
0.5000 mL | Freq: Once | INTRAMUSCULAR | Status: AC
  Administered 2014-09-07: 0.5 mL via INTRAMUSCULAR
  Filled 2014-09-07: qty 0.5

## 2014-09-07 MED ORDER — BUPIVACAINE HCL (PF) 0.5 % IJ SOLN
10.0000 mL | Freq: Once | INTRAMUSCULAR | Status: AC
  Administered 2014-09-07: 10 mL
  Filled 2014-09-07: qty 30

## 2014-09-07 MED ORDER — ONDANSETRON HCL 8 MG PO TABS: 8.0000 mg | ORAL_TABLET | Freq: Three times a day (TID) | ORAL | Status: DC | PRN

## 2014-09-07 NOTE — Discharge Instructions (Signed)
Nausea and Vomiting Nausea means you feel sick to your stomach. Throwing up (vomiting) is a reflex where stomach contents come out of your mouth. HOME CARE   Take medicine as told by your doctor.  Do not force yourself to eat. However, you do need to drink fluids.  If you feel like eating, eat a normal diet as told by your doctor.  Eat rice, wheat, potatoes, bread, lean meats, yogurt, fruits, and vegetables.  Avoid high-fat foods.  Drink enough fluids to keep your pee (urine) clear or pale yellow.  Ask your doctor how to replace body fluid losses (rehydrate). Signs of body fluid loss (dehydration) include:  Feeling very thirsty.  Dry lips and mouth.  Feeling dizzy.  Dark pee.  Peeing less than normal.  Feeling confused.  Fast breathing or heart rate. GET HELP RIGHT AWAY IF:   You have blood in your throw up.  You have black or bloody poop (stool).  You have a bad headache or stiff neck.  You feel confused.  You have bad belly (abdominal) pain.  You have chest pain or trouble breathing.  You do not pee at least once every 8 hours.  You have cold, clammy skin.  You keep throwing up after 24 to 48 hours.  You have a fever. MAKE SURE YOU:   Understand these instructions.  Will watch your condition.  Will get help right away if you are not doing well or get worse. Document Released: 06/29/2007 Document Revised: 04/04/2011 Document Reviewed: 06/11/2010 [facility] Patient Information 2015 [place], [place]. This information is not intended to replace advice given to you by your health care provider. Make sure you discuss any questions you have with your health care provider.  Wound Care Wound care helps prevent pain and infection.  You may need a tetanus shot if:  You cannot remember when you had your last tetanus shot.  You have never had a tetanus shot.  The injury broke your skin. If you need a tetanus shot and you choose not to have one, you may get  tetanus. Sickness from tetanus can be serious. HOME CARE   Only take medicine as told by your doctor.  Clean the wound daily with mild soap and water.  Change any bandages (dressings) as told by your doctor.  Put medicated cream and a bandage on the wound as told by your doctor.  Change the bandage if it gets wet, dirty, or starts to smell.  Take showers. Do not take baths, swim, or do anything that puts your wound under water.  Rest and raise (elevate) the wound until the pain and puffiness (swelling) are better.  Keep all doctor visits as told. GET HELP RIGHT AWAY IF:   Yellowish-white fluid (pus) comes from the wound.  Medicine does not lessen your pain.  There is a red streak going away from the wound.  You have a fever. MAKE SURE YOU:   Understand these instructions.  Will watch your condition.  Will get help right away if you are not doing well or get worse. Document Released: 10/20/2007 Document Revised: 04/04/2011 Document Reviewed: 05/16/2010 [facility] Patient Information 2015 [place], [place]. This information is not intended to replace advice given to you by your health care provider. Make sure you discuss any questions you have with your health care provider.

## 2014-09-07 NOTE — ED Provider Notes (Signed)
CSN: [id number]     Arrival date & time 09/07/14  0006 History   First MD Initiated Contact with Patient 09/07/14 0132     Chief Complaint  Patient presents with  . Laceration     (Consider location/radiation/quality/duration/timing/severity/associated sxs/prior Treatment) HPI patient reports she was in a department store and somehow a glass shelf broke and lacerated her left ring finger. She denies any numbness of her finger. She states her finger hurts when she tries to flex it. She denies any other injury. She states her last tetanus was 10 years ago.  PCP Dr Merlyn Albert  Past Medical History  Diagnosis Date  . PONV (postoperative nausea and vomiting)   . Herpes simplex without mention of complication   . BV (bacterial vaginosis) 05/02/2012  . Hematuria 12/18/2012  . UTI (lower urinary tract infection) 12/18/2012  . Interstitial cystitis    Past Surgical History  Procedure Laterality Date  . Tubal ligation    . Cesarean section    . Tonsillectomy and adenoidectomy    . Cholecystectomy  16 years ago    [facility]  . Dilitation & currettage/hystroscopy with thermachoice ablation N/A 03/09/2012    Procedure: DILATATION & CURETTAGE/HYSTEROSCOPY WITH THERMACHOICE ABLATION;  Surgeon: Florian Buff, MD;  Location: [facility];  Service: Gynecology;  Laterality: N/A;  D5W in -67ml,out 38ml, Total therapy time:22min 39sec    Family History  Problem Relation Age of Onset  . Hypertension Mother   . Heart disease Mother   . Hypertension Father   . Heart disease Father   . Heart attack Father   . Lung cancer Father   . Cancer Maternal Grandmother     breast  . Diabetes Paternal Grandmother   . Cancer Paternal Grandfather     lung  . Colon cancer Neg Hx    Social History  Substance Use Topics  . Smoking status: Never Smoker   . Smokeless tobacco: Never Used  . Alcohol Use: No   employed  OB History    Gravida Para Term Preterm AB TAB SAB Ectopic Multiple Living   3 3        3      Review  of Systems  All other systems reviewed and are negative.     Allergies  Codeine  Home Medications   Prior to Admission medications   Medication Sig Start Date End Date Taking? Authorizing Provider  Ascorbic Acid (VITAMIN C) 1000 MG tablet Take 1,000 mg by mouth daily.   Yes Historical Provider, MD  cetirizine (ZYRTEC) 10 MG tablet Take 10 mg by mouth daily.   Yes Historical Provider, MD  ELMIRON 100 MG capsule TAKE 2 CAPSULES BY MOUTH TWICE DAILY 02/03/14  Yes Florian Buff, MD  omeprazole (PRILOSEC) 20 MG capsule Take 20 mg by mouth daily.   Yes Historical Provider, MD  pentosan polysulfate (ELMIRON) 100 MG capsule Take 200 mg by mouth 2 (two) times daily.   Yes Historical Provider, MD  Cyanocobalamin (VITAMIN B 12 PO) Take 1 tablet by mouth daily.    Historical Provider, MD  Linaclotide Rolan Lipa) 145 MCG CAPS capsule Take 1 capsule (145 mcg total) by mouth daily. At least 30 minutes before breakfast 06/05/14   Orvil Feil, NP  mirabegron ER (MYRBETRIQ) 50 MG TB24 tablet Take 1 tablet (50 mg total) by mouth daily. 02/14/13   Florian Buff, MD   BP 128/62 mmHg  Pulse 66  Temp(Src) 98 F (36.7 C) (Oral)  Resp 16  Ht  5\' 3"  (1.6 m)  Wt 198 lb (89.812 kg)  BMI 35.08 kg/m2  SpO2 100%  LMP 08/25/2014  Vital signs normal   Physical Exam  Constitutional: She is oriented to person, place, and time. She appears well-developed and well-nourished.  Non-toxic appearance. She does not appear ill. No distress.  HENT:  Head: Normocephalic and atraumatic.  Right Ear: External ear normal.  Left Ear: External ear normal.  Nose: Nose normal. No mucosal edema or rhinorrhea.  Mouth/Throat: Mucous membranes are normal. No dental abscesses or uvula swelling.  Eyes: Conjunctivae and EOM are normal.  Neck: Normal range of motion and full passive range of motion without pain.  Pulmonary/Chest: Effort normal. No respiratory distress. She has no rhonchi. She exhibits no crepitus.  Abdominal: Normal  appearance.  Musculoskeletal: Normal range of motion. She exhibits tenderness. She exhibits no edema.  Moves all extremities well. Patient has a 1 cm linear laceration on the radial aspect of her left ring finger just proximal to the PIPJ. She has good range of motion of her finger. The wound is just through the dermis. There was no foreign body seen when the finger was numbed.   Neurological: She is alert and oriented to person, place, and time. She has normal strength. No cranial nerve deficit.  Skin: Skin is warm, dry and intact. No rash noted. No erythema. No pallor.  Psychiatric: She has a normal mood and affect. Her speech is normal and behavior is normal. Her mood appears not anxious.  Nursing note and vitals reviewed.      ED Course  Procedures (including critical care time)  Medications  Tdap (BOOSTRIX) injection 0.5 mL (0.5 mLs Intramuscular Given 09/07/14 0152)  bupivacaine (MARCAINE) 0.5 % injection 10 mL (10 mLs Infiltration Given 09/07/14 0156)   Patient's tetanus status was updated. She did become pale and lightheaded with some mild nausea after I had finished suturing her finger. She was laid flat and she started to feel better.    LACERATION REPAIR Performed by: Janice Norrie Authorized by: Janice Norrie Consent: Verbal consent obtained. Risks and benefits: risks, benefits and alternatives were discussed Consent given by: patient Patient identity confirmed: provided demographic data Prepped and Draped in normal sterile fashion Wound explored  Laceration Location: Left ring finger  Laceration Length: 1 cm  No Foreign Bodies seen or palpated  Anesthesia: digital block  Local anesthetic:marcaine  Anesthetic total: 3 ml  Irrigation method: syringe Amount of cleaning: standard  Skin closure: 5-0 nylon  Number of sutures: 3  Technique: simple interrupted  Patient tolerance: Patient tolerated the procedure well with no immediate complications.    Labs  Review Labs Reviewed - No data to display  Imaging Review No results found.    EKG Interpretation None      MDM   Final diagnoses:  Laceration of finger, initial encounter   Plan discharge  Rolland Porter, MD, Barbette Or, MD 09/07/14 [phone]

## 2014-09-07 NOTE — ED Notes (Signed)
Patient was at a department store and injured fourth digit, left hand, has laceration.

## 2014-09-07 NOTE — ED Provider Notes (Signed)
History  This chart was scribed for Marissa Bo, MD by Marlowe Kays, ED Scribe. This patient was seen in room [facility] and the patient's care was started at 9:17 PM.  Chief Complaint  Patient presents with  . Dizziness   The history is provided by the patient and medical records. No language interpreter was used.    HPI Comments:  Marissa Trevino is a 39 y.o. female who presents to the Emergency Department complaining of nausea and vomiting (one episode) that began earlier this evening. She reports associated light-headedness and states she feels as if she may pass out. She was here earlier this morning, approximately 18 hours ago (3 AM) to have a laceration of the left fourth digit sutured and is concerned about infection. She reports numbness of the finger but Marcaine was administered to repair the laceration. Pt reports the last time she ate was approximately five hours ago. She states she took a Zofran after vomiting this evening with significant relief of the nausea. She denies modifying factors. She denies fever or chills.   Past Medical History  Diagnosis Date  . PONV (postoperative nausea and vomiting)   . Herpes simplex without mention of complication   . BV (bacterial vaginosis) 05/02/2012  . Hematuria 12/18/2012  . UTI (lower urinary tract infection) 12/18/2012  . Interstitial cystitis    Past Surgical History  Procedure Laterality Date  . Tubal ligation    . Cesarean section    . Tonsillectomy and adenoidectomy    . Cholecystectomy  16 years ago    [facility]  . Dilitation & currettage/hystroscopy with thermachoice ablation N/A 03/09/2012    Procedure: DILATATION & CURETTAGE/HYSTEROSCOPY WITH THERMACHOICE ABLATION;  Surgeon: Florian Buff, MD;  Location: [facility];  Service: Gynecology;  Laterality: N/A;  D5W in -42ml,out 55ml, Total therapy time:54min 39sec    Family History  Problem Relation Age of Onset  . Hypertension Mother   . Heart disease Mother   . Hypertension  Father   . Heart disease Father   . Heart attack Father   . Lung cancer Father   . Cancer Maternal Grandmother     breast  . Diabetes Paternal Grandmother   . Cancer Paternal Grandfather     lung  . Colon cancer Neg Hx    Social History  Substance Use Topics  . Smoking status: Never Smoker   . Smokeless tobacco: Never Used  . Alcohol Use: No   OB History    Gravida Para Term Preterm AB TAB SAB Ectopic Multiple Living   3 3        3      Review of Systems  Gastrointestinal: Positive for nausea and vomiting.  Neurological: Positive for dizziness.  All other systems reviewed and are negative.   Allergies  Codeine  Home Medications   Prior to Admission medications   Medication Sig Start Date End Date Taking? Authorizing Provider  Ascorbic Acid (VITAMIN C) 1000 MG tablet Take 1,000 mg by mouth daily.    Historical Provider, MD  cetirizine (ZYRTEC) 10 MG tablet Take 10 mg by mouth daily.    Historical Provider, MD  Cyanocobalamin (VITAMIN B 12 PO) Take 1 tablet by mouth daily.    Historical Provider, MD  ELMIRON 100 MG capsule TAKE 2 CAPSULES BY MOUTH TWICE DAILY 02/03/14   Florian Buff, MD  Linaclotide [facility]) 145 MCG CAPS capsule Take 1 capsule (145 mcg total) by mouth daily. At least 30 minutes before breakfast 06/05/14  Orvil Feil, NP  mirabegron ER (MYRBETRIQ) 50 MG TB24 tablet Take 1 tablet (50 mg total) by mouth daily. 02/14/13   Florian Buff, MD  omeprazole (PRILOSEC) 20 MG capsule Take 20 mg by mouth daily.    Historical Provider, MD  pentosan polysulfate (ELMIRON) 100 MG capsule Take 200 mg by mouth 2 (two) times daily.    Historical Provider, MD   Triage Vitals: BP 138/75 mmHg  Pulse 60  Temp(Src) 98.4 F (36.9 C) (Oral)  SpO2 100%  LMP 08/25/2014 Physical Exam  Constitutional: She is oriented to person, place, and time. She appears well-developed and well-nourished.  HENT:  Head: Normocephalic and atraumatic.  Eyes: Conjunctivae and EOM are normal. Pupils  are equal, round, and reactive to light.  Neck: Normal range of motion and phonation normal. Neck supple.  Cardiovascular: Normal rate and regular rhythm.   Pulmonary/Chest: Effort normal and breath sounds normal. She exhibits no tenderness.  Abdominal: Soft. She exhibits no distension. There is no tenderness. There is no guarding.  Musculoskeletal: Normal range of motion.  Healing wound to left fourth digit. Sutures are intact. She has decreased sensation in the entire left ring finger, distal to the location of the digital block that was administered. Needle injection marks from the digital block are still visible. There is no redness or drainage from the finger wound. Flexion of the PIP and DIP joints of the left fourth finger are intact.  Neurological: She is alert and oriented to person, place, and time. She exhibits normal muscle tone.  Skin: Skin is warm and dry.  Psychiatric: She has a normal mood and affect. Her behavior is normal. Judgment and thought content normal.  Nursing note and vitals reviewed.   ED Course  Procedures (including critical care time) DIAGNOSTIC STUDIES: Oxygen Saturation is 100% on RA, normal by my interpretation.   COORDINATION OF CARE: 9:27 PM- Will prescribe Zofran and advised pt to stay on clear liquids until symptoms resolve. Pt verbalizes understanding and agrees to plan.  Medications - No data to display  Labs Review Labs Reviewed - No data to display  Imaging Review No results found.    EKG Interpretation None      MDM   Final diagnoses:  Visit for wound check  Non-intractable vomiting with nausea, vomiting of unspecified type    Left ring finger numbness, related to use of long-acting aesthetic for digital block. I doubt that this superficial laceration injured her digital nerve, or her flexor tendon apparatus. Vomiting was transient, single, time, and improved with Zofran. Her abdominal exam is benign. There is no indication for  ongoing acute abdominal problem.   Nursing Notes Reviewed/ Care Coordinated Applicable Imaging Reviewed Interpretation of Laboratory Data incorporated into ED treatment  The patient appears reasonably screened and/or stabilized for discharge and I doubt any other medical condition or other [facility] requiring further screening, evaluation, or treatment in the ED at this time prior to discharge.  Plan: Home Medications- Zofran; Home Treatments- rest, wound care; return here if the recommended treatment, does not improve the symptoms; Recommended follow up- PCP prn. Return ere for problems.   I personally performed the services described in this documentation, which was scribed in my presence. The recorded information has been reviewed and is accurate.    Marissa Bo, MD 09/07/14 2141

## 2014-09-07 NOTE — Discharge Instructions (Signed)
Keep the laceration clean and dry, do use triple antibiotic ointment on the wound. The sutures need to be removed in about 12 days. Recheck sooner for any signs of infection such as increased pain, increased redness, drainage of pus, red streaking into your hand. You received a tetanus booster tonight, it is good for 10 years for any further abrasions or lacerations or burns.     Laceration Care, Adult A laceration is a cut or lesion that goes through all layers of the skin and into the tissue just beneath the skin. TREATMENT  Some lacerations may not require closure. Some lacerations may not be able to be closed due to an increased risk of infection. It is important to see your caregiver as soon as possible after an injury to minimize the risk of infection and maximize the opportunity for successful closure. If closure is appropriate, pain medicines may be given, if needed. The wound will be cleaned to help prevent infection. Your caregiver will use stitches (sutures), staples, wound glue (adhesive), or skin adhesive strips to repair the laceration. These tools bring the skin edges together to allow for faster healing and a better cosmetic outcome. However, all wounds will heal with a scar. Once the wound has healed, scarring can be minimized by covering the wound with sunscreen during the day for 1 full year. HOME CARE INSTRUCTIONS  For sutures or staples:  Keep the wound clean and dry.  If you were given a bandage (dressing), you should change it at least once a day. Also, change the dressing if it becomes wet or dirty, or as directed by your caregiver.  Wash the wound with soap and water 2 times a day. Rinse the wound off with water to remove all soap. Pat the wound dry with a clean towel.  After cleaning, apply a thin layer of the antibiotic ointment as recommended by your caregiver. This will help prevent infection and keep the dressing from sticking.  You may shower as usual after the  first 24 hours. Do not soak the wound in water until the sutures are removed.  Only take over-the-counter or prescription medicines for pain, discomfort, or fever as directed by your caregiver.  Get your sutures or staples removed as directed by your caregiver. For skin adhesive strips:  Keep the wound clean and dry.  Do not get the skin adhesive strips wet. You may bathe carefully, using caution to keep the wound dry.  If the wound gets wet, pat it dry with a clean towel.  Skin adhesive strips will fall off on their own. You may trim the strips as the wound heals. Do not remove skin adhesive strips that are still stuck to the wound. They will fall off in time. For wound adhesive:  You may briefly wet your wound in the shower or bath. Do not soak or scrub the wound. Do not swim. Avoid periods of heavy perspiration until the skin adhesive has fallen off on its own. After showering or bathing, gently pat the wound dry with a clean towel.  Do not apply liquid medicine, cream medicine, or ointment medicine to your wound while the skin adhesive is in place. This may loosen the film before your wound is healed.  If a dressing is placed over the wound, be careful not to apply tape directly over the skin adhesive. This may cause the adhesive to be pulled off before the wound is healed.  Avoid prolonged exposure to sunlight or tanning lamps while the  skin adhesive is in place. Exposure to ultraviolet light in the first year will darken the scar.  The skin adhesive will usually remain in place for 5 to 10 days, then naturally fall off the skin. Do not pick at the adhesive film. You may need a tetanus shot if:  You cannot remember when you had your last tetanus shot.  You have never had a tetanus shot. If you get a tetanus shot, your arm may swell, get red, and feel warm to the touch. This is common and not a problem. If you need a tetanus shot and you choose not to have one, there is a rare  chance of getting tetanus. Sickness from tetanus can be serious. SEEK MEDICAL CARE IF:   You have redness, swelling, or increasing pain in the wound.  You see a red line that goes away from the wound.  You have yellowish-white fluid (pus) coming from the wound.  You have a fever.  You notice a bad smell coming from the wound or dressing.  Your wound breaks open before or after sutures have been removed.  You notice something coming out of the wound such as wood or glass.  Your wound is on your hand or foot and you cannot move a finger or toe. SEEK IMMEDIATE MEDICAL CARE IF:   Your pain is not controlled with prescribed medicine.  You have severe swelling around the wound causing pain and numbness or a change in color in your arm, hand, leg, or foot.  Your wound splits open and starts bleeding.  You have worsening numbness, weakness, or loss of function of any joint around or beyond the wound.  You develop painful lumps near the wound or on the skin anywhere on your body. MAKE SURE YOU:   Understand these instructions.  Will watch your condition.  Will get help right away if you are not doing well or get worse. Document Released: 01/10/2005 Document Revised: 04/04/2011 Document Reviewed: 07/06/2010 [facility] Patient Information 2015 [place], [place]. This information is not intended to replace advice given to you by your health care provider. Make sure you discuss any questions you have with your health care provider.

## 2014-09-07 NOTE — ED Notes (Signed)
Pt. Reports nausea/vomiting and dizziness starting tonight. Denies pain.

## 2014-09-09 ENCOUNTER — Ambulatory Visit: Payer: 59 | Admitting: Gastroenterology

## 2014-09-15 ENCOUNTER — Ambulatory Visit (INDEPENDENT_AMBULATORY_CARE_PROVIDER_SITE_OTHER): Payer: 59 | Admitting: Adult Health

## 2014-09-15 ENCOUNTER — Encounter: Payer: Self-pay | Admitting: Adult Health

## 2014-09-15 VITALS — BP 134/70 | HR 80 | Ht 63.0 in | Wt 196.0 lb

## 2014-09-15 DIAGNOSIS — R319 Hematuria, unspecified: Secondary | ICD-10-CM

## 2014-09-15 DIAGNOSIS — R102 Pelvic and perineal pain: Secondary | ICD-10-CM | POA: Diagnosis not present

## 2014-09-15 DIAGNOSIS — N939 Abnormal uterine and vaginal bleeding, unspecified: Secondary | ICD-10-CM | POA: Diagnosis not present

## 2014-09-15 HISTORY — DX: Abnormal uterine and vaginal bleeding, unspecified: N93.9

## 2014-09-15 HISTORY — DX: Pelvic and perineal pain: R10.2

## 2014-09-15 LAB — POCT URINALYSIS DIPSTICK
GLUCOSE UA: NEGATIVE
Nitrite, UA: NEGATIVE
PROTEIN UA: NEGATIVE

## 2014-09-15 MED ORDER — DOXYCYCLINE HYCLATE 100 MG PO CAPS: 100.0000 mg | ORAL_CAPSULE | Freq: Two times a day (BID) | ORAL | Status: DC

## 2014-09-15 NOTE — Patient Instructions (Signed)
Return in 2 weeks for [place] then see me in 2 day after that for pap and physical Take doxycycline 1 bid x 10 days Dysfunctional Uterine Bleeding Normally, menstrual periods begin between ages 54 to 26 in young women. A normal menstrual cycle/period may begin every 23 days up to 35 days and lasts from 1 to 7 days. Around 12 to 14 days before your menstrual period starts, ovulation (ovary produces an egg) occurs. When counting the time between menstrual periods, count from the first day of bleeding of the previous period to the first day of bleeding of the next period. Dysfunctional (abnormal) uterine bleeding is bleeding that is different from a normal menstrual period. Your periods may come earlier or later than usual. They may be lighter, have blood clots or be heavier. You may have bleeding between periods, or you may skip one period or more. You may have bleeding after sexual intercourse, bleeding after menopause, or no menstrual period. CAUSES   Pregnancy (normal, miscarriage, tubal).  IUDs (intrauterine device, birth control).  Birth control pills.  Hormone treatment.  Menopause.  Infection of the cervix.  Blood clotting problems.  Infection of the inside lining of the uterus.  Endometriosis, inside lining of the uterus growing in the pelvis and other female organs.  Adhesions (scar tissue) inside the uterus.  Obesity or severe weight loss.  Uterine polyps inside the uterus.  Cancer of the vagina, cervix, or uterus.  Ovarian cysts or polycystic ovary syndrome.  Medical problems (diabetes, thyroid disease).  Uterine fibroids (noncancerous tumor).  Problems with your female hormones.  Endometrial hyperplasia, very thick lining and enlarged cells inside of the uterus.  Medicines that interfere with ovulation.  Radiation to the pelvis or abdomen.  Chemotherapy. DIAGNOSIS   Your doctor will discuss the history of your menstrual periods, medicines you are taking, changes  in your weight, stress in your life, and any medical problems you may have.  Your doctor will do a physical and pelvic examination.  Your doctor may want to perform certain tests to make a diagnosis, such as:  Pap test.  Blood tests.  Cultures for infection.  CT scan.  Ultrasound.  Hysteroscopy.  Laparoscopy.  MRI.  Hysterosalpingography.  D and C.  Endometrial biopsy. TREATMENT  Treatment will depend on the cause of the dysfunctional uterine bleeding (DUB). Treatment may include:  Observing your menstrual periods for a couple of months.  Prescribing medicines for medical problems, including:  Antibiotics.  Hormones.  Birth control pills.  Removing an IUD (intrauterine device, birth control).  Surgery:  D and C (scrape and remove tissue from inside the uterus).  Laparoscopy (examine inside the abdomen with a lighted tube).  Uterine ablation (destroy lining of the uterus with electrical current, laser, heat, or freezing).  Hysteroscopy (examine cervix and uterus with a lighted tube).  Hysterectomy (remove the uterus). HOME CARE INSTRUCTIONS   If medicines were prescribed, take exactly as directed. Do not change or switch medicines without consulting your caregiver.  Long term heavy bleeding may result in iron deficiency. Your caregiver may have prescribed iron pills. They help replace the iron that your body lost from heavy bleeding. Take exactly as directed.  Do not take aspirin or medicines that contain aspirin one week before or during your menstrual period. Aspirin may make the bleeding worse.  If you need to change your sanitary pad or tampon more than once every 2 hours, stay in bed with your feet elevated and a cold pack on  your lower abdomen. Rest as much as possible, until the bleeding stops or slows down.  Eat well-balanced meals. Eat foods high in iron. Examples are:  Leafy green vegetables.  Whole-grain breads and  cereals.  Eggs.  Meat.  Liver.  Do not try to lose weight until the abnormal bleeding has stopped and your blood iron level is back to normal. Do not lift more than ten pounds or do strenuous activities when you are bleeding.  For a couple of months, make note on your calendar, marking the start and ending of your period, and the type of bleeding (light, medium, heavy, spotting, clots or missed periods). This is for your caregiver to better evaluate your problem. SEEK MEDICAL CARE IF:   You develop nausea (feeling sick to your stomach) and vomiting, dizziness, or diarrhea while you are taking your medicine.  You are getting lightheaded or weak.  You have any problems that may be related to the medicine you are taking.  You develop pain with your DUB.  You want to remove your IUD.  You want to stop or change your birth control pills or hormones.  You have any type of abnormal bleeding mentioned above.  You are over 81 years old and have not had a menstrual period yet.  You are 40 years old and you are still having menstrual periods.  You have any of the symptoms mentioned above.  You develop a rash. SEEK IMMEDIATE MEDICAL CARE IF:   An oral temperature above 102 F (38.9 C) develops.  You develop chills.  You are changing your sanitary pad or tampon more than once an hour.  You develop abdominal pain.  You pass out or faint. Document Released: 01/08/2000 Document Revised: 04/04/2011 Document Reviewed: 12/09/2008 [facility] Patient Information 2015 [place], [place]. This information is not intended to replace advice given to you by your health care provider. Make sure you discuss any questions you have with your health care provider. Pelvic Pain Female pelvic pain can be caused by many different things and start from a variety of places. Pelvic pain refers to pain that is located in the lower half of the abdomen and between your hips. The pain may occur over a short period  of time (acute) or may be reoccurring (chronic). The cause of pelvic pain may be related to disorders affecting the female reproductive organs (gynecologic), but it may also be related to the bladder, kidney stones, an intestinal complication, or muscle or skeletal problems. Getting help right away for pelvic pain is important, especially if there has been severe, sharp, or a sudden onset of unusual pain. It is also important to get help right away because some types of pelvic pain can be life threatening.  CAUSES  Below are only some of the causes of pelvic pain. The causes of pelvic pain can be in one of several categories.   Gynecologic.  Pelvic inflammatory disease.  Sexually transmitted infection.  Ovarian cyst or a twisted ovarian ligament (ovarian torsion).  Uterine lining that grows outside the uterus (endometriosis).  Fibroids, cysts, or tumors.  Ovulation.  Pregnancy.  Pregnancy that occurs outside the uterus (ectopic pregnancy).  Miscarriage.  Labor.  Abruption of the placenta or ruptured uterus.  Infection.  Uterine infection (endometritis).  Bladder infection.  Diverticulitis.  Miscarriage related to a uterine infection (septic abortion).  Bladder.  Inflammation of the bladder (cystitis).  Kidney stone(s).  Gastrointestinal.  Constipation.  Diverticulitis.  Neurologic.  Trauma.  Feeling pelvic pain because of  mental or emotional causes (psychosomatic).  Cancers of the bowel or pelvis. EVALUATION  Your caregiver will want to take a careful history of your concerns. This includes recent changes in your health, a careful gynecologic history of your periods (menses), and a sexual history. Obtaining your family history and medical history is also important. Your caregiver may suggest a pelvic exam. A pelvic exam will help identify the location and severity of the pain. It also helps in the evaluation of which organ system may be involved. In order to  identify the cause of the pelvic pain and be properly treated, your caregiver may order tests. These tests may include:   A pregnancy test.  Pelvic ultrasonography.  An X-ray exam of the abdomen.  A urinalysis or evaluation of vaginal discharge.  Blood tests. HOME CARE INSTRUCTIONS   Only take over-the-counter or prescription medicines for pain, discomfort, or fever as directed by your caregiver.   Rest as directed by your caregiver.   Eat a balanced diet.   Drink enough fluids to make your urine clear or pale yellow, or as directed.   Avoid sexual intercourse if it causes pain.   Apply warm or cold compresses to the lower abdomen depending on which one helps the pain.   Avoid stressful situations.   Keep a journal of your pelvic pain. Write down when it started, where the pain is located, and if there are things that seem to be associated with the pain, such as food or your menstrual cycle.  Follow up with your caregiver as directed.  SEEK MEDICAL CARE IF:  Your medicine does not help your pain.  You have abnormal vaginal discharge. SEEK IMMEDIATE MEDICAL CARE IF:   You have heavy bleeding from the vagina.   Your pelvic pain increases.   You feel light-headed or faint.   You have chills.   You have pain with urination or blood in your urine.   You have uncontrolled diarrhea or vomiting.   You have a fever or persistent symptoms for more than 3 days.  You have a fever and your symptoms suddenly get worse.   You are being physically or sexually abused.  MAKE SURE YOU:  Understand these instructions.  Will watch your condition.  Will get help if you are not doing well or get worse. Document Released: 12/08/2003 Document Revised: 05/27/2013 Document Reviewed: 05/02/2011 [facility] Patient Information 2015 [place], [place]. This information is not intended to replace advice given to you by your health care provider. Make sure you discuss any  questions you have with your health care provider.

## 2014-09-15 NOTE — Progress Notes (Signed)
Subjective:     Patient ID: Marissa Trevino, female   DOB: 1975-11-30, 39 y.o.   MRN: [id number]  HPI Anie is a 39 year old white female in complaining of vaginal bleeding for 3 weeks and pelvic and back pain.She is sp tubal and ablation, but she still has a period and it can be heavy for 1-2 days.Has not had bladder irrigation lately but elmiron is doing well.  Review of Systems Patient denies any headaches, hearing loss, fatigue, blurred vision, shortness of breath, chest pain,  problems with bowel movements, urination, or intercourse. No joint pain or mood swings. See HPI for positives. Reviewed past medical,surgical, social and family history. Reviewed medications and allergies.     Objective:   Physical Exam BP 134/70 mmHg  Pulse 80  Ht 5\' 3"  (1.6 m)  Wt 196 lb (88.905 kg)  BMI 34.73 kg/m2  LMP 08/01/2016urine 3+ leuks and 3+blood, Skin warm and dry.Pelvic: external genitalia is normal in appearance no lesions, vagina: pink with good moisture and rugae,urethra has no lesions or masses noted, cervix:bulbous and has nabothian cyst at 5 o'clock, uterus: normal size, shape and contour,mildly tender, no masses felt, adnexa: no masses, RLQ tender tenderness noted. Bladder is mildly tender and no masses felt.    Assessment:     AUB Pelvic pain Hematuria     Plan:    Rx doxycycline 100 mg 1 bid x 10 days #20, no refills UA C&S sent Increase water Return in 2 weeks for gyn [place] and then 2 days after that for pap and physical Review handouts on pelvic pain and AUB

## 2014-09-16 LAB — URINALYSIS, ROUTINE W REFLEX MICROSCOPIC
Bilirubin, UA: NEGATIVE
GLUCOSE, UA: NEGATIVE
Ketones, UA: NEGATIVE
Nitrite, UA: NEGATIVE
PH UA: 6.5 (ref 5.0–7.5)
Protein, UA: NEGATIVE
Specific Gravity, UA: 1.006 (ref 1.005–1.030)
Urobilinogen, Ur: 0.2 mg/dL (ref 0.2–1.0)

## 2014-09-16 LAB — MICROSCOPIC EXAMINATION: CASTS: NONE SEEN /LPF

## 2014-09-16 LAB — URINE CULTURE

## 2014-09-25 ENCOUNTER — Encounter: Payer: Self-pay | Admitting: Gastroenterology

## 2014-09-25 ENCOUNTER — Ambulatory Visit (INDEPENDENT_AMBULATORY_CARE_PROVIDER_SITE_OTHER): Payer: 59 | Admitting: Gastroenterology

## 2014-09-25 VITALS — BP 127/80 | HR 72 | Temp 97.6°F | Ht 63.0 in | Wt 193.2 lb

## 2014-09-25 DIAGNOSIS — K5900 Constipation, unspecified: Secondary | ICD-10-CM | POA: Diagnosis not present

## 2014-09-25 DIAGNOSIS — K219 Gastro-esophageal reflux disease without esophagitis: Secondary | ICD-10-CM | POA: Diagnosis not present

## 2014-09-25 NOTE — Progress Notes (Signed)
      Primary Care Physician: Delphina Cahill, MD  Primary Gastroenterologist:  Barney Drain, MD   Chief Complaint  Patient presents with  . Follow-up    HPI: Marissa Trevino is a 39 y.o. female here for follow up of GERD/dysphagia. Last seen in 05/2014. BPE 04/2014 unremarkable. Has been on Prilosec. No swallowing problems. No heartburn, abd pain. Constipation well managed on Linzess every 2-3 days. No melena, brbpr.    Current Outpatient Prescriptions  Medication Sig Dispense Refill  . Ascorbic Acid (VITAMIN C) 1000 MG tablet Take 1,000 mg by mouth daily.    . cetirizine (ZYRTEC) 10 MG tablet Take 10 mg by mouth daily.    Marland Kitchen doxycycline (VIBRAMYCIN) 100 MG capsule Take 1 capsule (100 mg total) by mouth 2 (two) times daily. 20 capsule 0  . ELMIRON 100 MG capsule TAKE 2 CAPSULES BY MOUTH TWICE DAILY 120 capsule 11  . Linaclotide (LINZESS) 145 MCG CAPS capsule Take 1 capsule (145 mcg total) by mouth daily. At least 30 minutes before breakfast 30 capsule 5  . mirabegron ER (MYRBETRIQ) 50 MG TB24 tablet Take 1 tablet (50 mg total) by mouth daily. 30 tablet 11  . omeprazole (PRILOSEC) 20 MG capsule Take 20 mg by mouth daily.    . ondansetron (ZOFRAN) 8 MG tablet Take 1 tablet (8 mg total) by mouth every 8 (eight) hours as needed for nausea or vomiting. 10 tablet 0   No current facility-administered medications for this visit.    Allergies as of 09/25/2014 - Review Complete 09/25/2014  Allergen Reaction Noted  . Codeine  02/27/2006    ROS:  General: Negative for anorexia, weight loss, fever, chills, fatigue, weakness. ENT: Negative for hoarseness, difficulty swallowing , nasal congestion. CV: Negative for chest pain, angina, palpitations, dyspnea on exertion, peripheral edema.  Respiratory: Negative for dyspnea at rest, dyspnea on exertion, cough, sputum, wheezing.  GI: See history of present illness. GU:  Negative for dysuria, hematuria, urinary incontinence, urinary frequency, nocturnal  urination.  Endo: Negative for unusual weight change.    Physical Examination:   BP 127/80 mmHg  Pulse 72  Temp(Src) 97.6 F (36.4 C)  Ht 5\' 3"  (1.6 m)  Wt 193 lb 3.2 oz (87.635 kg)  BMI 34.23 kg/m2  LMP 09/15/2014 (Approximate)  General: Well-nourished, well-developed in no acute distress.  Eyes: No icterus. Mouth: Oropharyngeal mucosa moist and pink , no lesions erythema or exudate. Lungs: Clear to auscultation bilaterally.  Heart: Regular rate and rhythm, no murmurs rubs or gallops.  Abdomen: Bowel sounds are normal, nontender, nondistended, no hepatosplenomegaly or masses, no abdominal bruits or hernia , no rebound or guarding.   Extremities: No lower extremity edema. No clubbing or deformities. Neuro: Alert and oriented x 4   Skin: Warm and dry, no jaundice.   Psych: Alert and cooperative, normal mood and affect.   Imaging Studies: No results found.

## 2014-09-25 NOTE — Patient Instructions (Signed)
1. Continue omeprazole daily.  2. Return to office in one year or sooner if needed.

## 2014-09-29 NOTE — Assessment & Plan Note (Signed)
Doing well on current PPI regimen. Discussed antireflux measures. Return to the office in one year or sooner if needed.

## 2014-09-29 NOTE — Assessment & Plan Note (Signed)
Continue Linzess. 

## 2014-09-30 ENCOUNTER — Other Ambulatory Visit: Payer: 59

## 2014-09-30 NOTE — Progress Notes (Signed)
cc'ed to pcp °

## 2014-10-01 ENCOUNTER — Ambulatory Visit (INDEPENDENT_AMBULATORY_CARE_PROVIDER_SITE_OTHER): Payer: 59

## 2014-10-01 DIAGNOSIS — R102 Pelvic and perineal pain: Secondary | ICD-10-CM | POA: Diagnosis not present

## 2014-10-01 DIAGNOSIS — N939 Abnormal uterine and vaginal bleeding, unspecified: Secondary | ICD-10-CM | POA: Diagnosis not present

## 2014-10-01 NOTE — Progress Notes (Signed)
PELVIC US TA/TV:   Subseptate vs bicornuate retroverted uterus,normal ov's bilat (mobile),EEC 7.24mm no free fluid,no pain during ultrasound

## 2014-10-02 ENCOUNTER — Other Ambulatory Visit (HOSPITAL_COMMUNITY)
Admission: RE | Admit: 2014-10-02 | Discharge: 2014-10-02 | Disposition: A | Discharge status: Home / Self Care | Payer: 59 | Source: Ambulatory Visit | Attending: Adult Health | Admitting: Adult Health

## 2014-10-02 ENCOUNTER — Ambulatory Visit (INDEPENDENT_AMBULATORY_CARE_PROVIDER_SITE_OTHER): Payer: 59 | Admitting: Adult Health

## 2014-10-02 ENCOUNTER — Encounter: Payer: Self-pay | Admitting: Adult Health

## 2014-10-02 VITALS — BP 122/80 | HR 92 | Ht 63.0 in | Wt 193.0 lb

## 2014-10-02 DIAGNOSIS — Z01411 Encounter for gynecological examination (general) (routine) with abnormal findings: Secondary | ICD-10-CM | POA: Insufficient documentation

## 2014-10-02 DIAGNOSIS — N939 Abnormal uterine and vaginal bleeding, unspecified: Secondary | ICD-10-CM

## 2014-10-02 DIAGNOSIS — Z113 Encounter for screening for infections with a predominantly sexual mode of transmission: Secondary | ICD-10-CM | POA: Diagnosis present

## 2014-10-02 DIAGNOSIS — Z01419 Encounter for gynecological examination (general) (routine) without abnormal findings: Secondary | ICD-10-CM

## 2014-10-02 DIAGNOSIS — Z1151 Encounter for screening for human papillomavirus (HPV): Secondary | ICD-10-CM | POA: Insufficient documentation

## 2014-10-02 DIAGNOSIS — R102 Pelvic and perineal pain unspecified side: Secondary | ICD-10-CM

## 2014-10-02 LAB — HEMOCCULT GUIAC POC 1CARD (OFFICE): Fecal Occult Blood, POC: NEGATIVE

## 2014-10-02 NOTE — Patient Instructions (Signed)
Get mammogram  Call when want labs Physical in 1 year

## 2014-10-02 NOTE — Progress Notes (Signed)
Patient ID: Marissa Trevino, female   DOB: 11/28/1975, 39 y.o.   MRN: [id number] History of Present Illness: Marissa Trevino is a 39 year old white female in for well woman gyn exam and pap.She is having bleeding and pelvic pain,she is sp ablation, and had [place] yesterday.She has had constipation and is on linzess.Last pap ASCUS with negative HPV.   Current Medications, Allergies, Past Medical History, Past Surgical History, Family History and Social History were reviewed in [employer] record.     Review of Systems: Patient denies any headaches, hearing loss, fatigue, blurred vision, shortness of breath, chest pain,  problems with bowel urination, or intercourse. No joint pain or mood swings.See HPI for positives.    Physical Exam:BP 122/80 mmHg  Pulse 92  Ht 5\' 3"  (1.6 m)  Wt 193 lb (87.544 kg)  BMI 34.20 kg/m2  LMP 09/15/2014 (Approximate) General:  Well developed, well nourished, no acute distress Skin:  Warm and dry Neck:  Midline trachea, normal thyroid, good ROM, no lymphadenopathy Lungs; Clear to auscultation bilaterally Breast:  No dominant palpable mass, retraction, or nipple discharge Cardiovascular: Regular rate and rhythm Abdomen:  Soft, non tender, no hepatosplenomegaly Pelvic:  External genitalia is normal in appearance, no lesions.  The vagina is normal in appearance. Urethra has no lesions or masses. The cervix is bulbous, has stenotic os, and nabothian cyst at 1 o'clock.  Uterus is felt to be normal size, shape, and contour.  No adnexal masses or tenderness noted.Bladder is non tender, no masses felt. Rectal: Good sphincter tone, no polyps, internal hemorrhoids felt.  Hemoccult negative.+rectocele Extremities/musculoskeletal:  No swelling or varicosities noted, no clubbing or cyanosis Psych:  No mood changes, alert and cooperative,seems happy Reviewed [place] with pt,  Uterus 8.0 x 7.35 x 5.68 cm, Subseptate vs bicornuate retroverted  uterus  Endometrium 7.6 mm, endometrial stripe is separated at the fundus,rt 7.52mm,lt 70mm  Right ovary 2.7 x 1.5 x 2.4 cm, wnl  Left ovary 3.53 x 2.1 x 2.2 cm, wnl    Technician Comments:  PELVIC US TA/TV: Subseptate vs bicornuate retroverted uterus,normal ov's bilat (mobile),EEC 7.39mm no free fluid,no pain during ultrasound If has septum may be why ablation did not work, may consider hysterectomy. Impression: Well woman gyn exam with pap Pelvic pain AUB     Plan: Physical in 1 year,or before if wants to talk surgery  Call when wants labs Get mammogram now F/U with GI about constipation

## 2014-10-04 ENCOUNTER — Emergency Department (HOSPITAL_COMMUNITY): Payer: 59

## 2014-10-04 ENCOUNTER — Encounter (HOSPITAL_COMMUNITY): Payer: Self-pay | Admitting: *Deleted

## 2014-10-04 ENCOUNTER — Emergency Department (HOSPITAL_COMMUNITY)
Admission: EM | Admit: 2014-10-04 | Discharge: 2014-10-04 | Disposition: A | Discharge status: Home / Self Care | Payer: 59 | Source: Home / Self Care | Attending: Emergency Medicine | Admitting: Emergency Medicine

## 2014-10-04 DIAGNOSIS — N201 Calculus of ureter: Secondary | ICD-10-CM | POA: Diagnosis not present

## 2014-10-04 DIAGNOSIS — N132 Hydronephrosis with renal and ureteral calculous obstruction: Secondary | ICD-10-CM | POA: Diagnosis not present

## 2014-10-04 DIAGNOSIS — R109 Unspecified abdominal pain: Secondary | ICD-10-CM

## 2014-10-04 DIAGNOSIS — N200 Calculus of kidney: Secondary | ICD-10-CM

## 2014-10-04 DIAGNOSIS — N6320 Unspecified lump in the left breast, unspecified quadrant: Secondary | ICD-10-CM

## 2014-10-04 LAB — URINALYSIS, ROUTINE W REFLEX MICROSCOPIC
Bilirubin Urine: NEGATIVE
Glucose, UA: NEGATIVE mg/dL
Ketones, ur: NEGATIVE mg/dL
LEUKOCYTES UA: NEGATIVE
NITRITE: NEGATIVE
Protein, ur: 30 mg/dL — AB
UROBILINOGEN UA: 0.2 mg/dL (ref 0.0–1.0)
pH: 5.5 (ref 5.0–8.0)

## 2014-10-04 LAB — URINE MICROSCOPIC-ADD ON

## 2014-10-04 LAB — COMPREHENSIVE METABOLIC PANEL
ALBUMIN: 4.2 g/dL (ref 3.5–5.0)
ALT: 15 U/L (ref 14–54)
ANION GAP: 7 (ref 5–15)
AST: 16 U/L (ref 15–41)
Alkaline Phosphatase: 55 U/L (ref 38–126)
BUN: 16 mg/dL (ref 6–20)
CALCIUM: 8.8 mg/dL — AB (ref 8.9–10.3)
CHLORIDE: 107 mmol/L (ref 101–111)
CO2: 24 mmol/L (ref 22–32)
Creatinine, Ser: 0.69 mg/dL (ref 0.44–1.00)
GFR calc Af Amer: >60 mL/min (ref >60)
GFR calc non Af Amer: >60 mL/min (ref >60)
GLUCOSE: 129 mg/dL — AB (ref 65–99)
POTASSIUM: 3.4 mmol/L — AB (ref 3.5–5.1)
SODIUM: 138 mmol/L (ref 135–145)
Total Bilirubin: 0.5 mg/dL (ref 0.3–1.2)
Total Protein: 7.1 g/dL (ref 6.5–8.1)

## 2014-10-04 LAB — LIPASE, BLOOD: Lipase: 14 U/L — ABNORMAL LOW (ref 22–51)

## 2014-10-04 LAB — PREGNANCY, URINE: Preg Test, Ur: NEGATIVE

## 2014-10-04 MED ORDER — TAMSULOSIN HCL 0.4 MG PO CAPS: 0.4000 mg | ORAL_CAPSULE | Freq: Every day | ORAL | Status: DC

## 2014-10-04 MED ORDER — HYDROCODONE-ACETAMINOPHEN 5-325 MG PO TABS: 1.0000 | ORAL_TABLET | ORAL | Status: DC | PRN

## 2014-10-04 MED ORDER — HYDROMORPHONE HCL 1 MG/ML IJ SOLN
1.0000 mg | Freq: Once | INTRAMUSCULAR | Status: AC
  Administered 2014-10-04: 1 mg via INTRAVENOUS
  Filled 2014-10-04: qty 1

## 2014-10-04 MED ORDER — METOCLOPRAMIDE HCL 5 MG/ML IJ SOLN
10.0000 mg | Freq: Once | INTRAMUSCULAR | Status: AC
  Administered 2014-10-04: 10 mg via INTRAVENOUS
  Filled 2014-10-04: qty 2

## 2014-10-04 MED ORDER — ONDANSETRON HCL 4 MG/2ML IJ SOLN
4.0000 mg | Freq: Once | INTRAMUSCULAR | Status: AC
  Administered 2014-10-04: 4 mg via INTRAVENOUS
  Filled 2014-10-04: qty 2

## 2014-10-04 MED ORDER — DIPHENHYDRAMINE HCL 50 MG/ML IJ SOLN
25.0000 mg | Freq: Once | INTRAMUSCULAR | Status: AC
  Administered 2014-10-04: 25 mg via INTRAVENOUS
  Filled 2014-10-04: qty 1

## 2014-10-04 MED ORDER — ONDANSETRON HCL 4 MG PO TABS: 4.0000 mg | ORAL_TABLET | Freq: Four times a day (QID) | ORAL | Status: DC | PRN

## 2014-10-04 NOTE — ED Provider Notes (Signed)
CSN: [id number]     Arrival date & time 10/04/14  0424 History   First MD Initiated Contact with Patient 10/04/14 [phone]     Chief Complaint  Patient presents with  . Flank Pain     (Consider location/radiation/quality/duration/timing/severity/associated sxs/prior Treatment) Patient is a 39 y.o. female presenting with flank pain. The history is provided by the patient.  Flank Pain  She started having left flank pain yesterday. Pain was present off and on through the day. There is some radiation of the left lower abdomen. She was awakened at 1 AM by severe left flank pain which he rates at 10/10. There is associated nausea and vomiting. She denies fever, chills, sweats. She denies dysuria or urinary urgency or frequency or hesitancy. She had been taking ibuprofen during the day which did seem to give temporary relief. She has a history of UTIs, but she has never had pain like this before. Nothing made the pain better nothing made it worse.  Past Medical History  Diagnosis Date  . PONV (postoperative nausea and vomiting)   . Herpes simplex without mention of complication   . BV (bacterial vaginosis) 05/02/2012  . Hematuria 12/18/2012  . UTI (lower urinary tract infection) 12/18/2012  . Interstitial cystitis   . Cancer     skin  . Pelvic pain in female 09/15/2014  . Abnormal uterine bleeding (AUB) 09/15/2014   Past Surgical History  Procedure Laterality Date  . Tubal ligation    . Cesarean section    . Tonsillectomy and adenoidectomy    . Cholecystectomy  16 years ago    [facility]  . Dilitation & currettage/hystroscopy with thermachoice ablation N/A 03/09/2012    Procedure: DILATATION & CURETTAGE/HYSTEROSCOPY WITH THERMACHOICE ABLATION;  Surgeon: Florian Buff, MD;  Location: [facility];  Service: Gynecology;  Laterality: N/A;  D5W in -35ml,out 32ml, Total therapy time:68min 39sec    Family History  Problem Relation Age of Onset  . Hypertension Mother   . Heart disease Mother   . Hypertension  Father   . Heart disease Father   . Heart attack Father   . Lung cancer Father   . Cancer Father   . Cancer Maternal Grandmother     breast  . Diabetes Paternal Grandmother   . Cancer Paternal Grandfather     lung  . Colon cancer Neg Hx   . Heart attack Brother   . Hypertension Brother   . Hyperlipidemia Brother   . Other Brother     brain tumor   Social History  Substance Use Topics  . Smoking status: Never Smoker   . Smokeless tobacco: Never Used  . Alcohol Use: No   OB History    Gravida Para Term Preterm AB TAB SAB Ectopic Multiple Living   3 3        3      Review of Systems  Genitourinary: Positive for flank pain.  All other systems reviewed and are negative.     Allergies  Codeine  Home Medications   Prior to Admission medications   Medication Sig Start Date End Date Taking? Authorizing Provider  cetirizine (ZYRTEC) 10 MG tablet Take 10 mg by mouth daily.    Historical Provider, MD  ELMIRON 100 MG capsule TAKE 2 CAPSULES BY MOUTH TWICE DAILY 02/03/14   Florian Buff, MD  Linaclotide [facility]) 145 MCG CAPS capsule Take 1 capsule (145 mcg total) by mouth daily. At least 30 minutes before breakfast 06/05/14   Orvil Feil, NP  omeprazole (PRILOSEC) 20 MG capsule Take 20 mg by mouth daily.    Historical Provider, MD   BP 143/86 mmHg  Pulse 96  Temp(Src) 98.7 F (37.1 C) (Oral)  Resp 20  Ht 5\' 3"  (1.6 m)  Wt 193 lb (87.544 kg)  BMI 34.20 kg/m2  SpO2 100%  LMP 09/15/2014 (Approximate) Physical Exam  Nursing note and vitals reviewed.  Obese20 year old female,  rocking back and forth pain, but is in no acute distress. Vital signs are significant for borderline hypertension. Oxygen saturation is 100%, which is normal. Head is normocephalic and atraumatic. PERRLA, EOMI. Oropharynx is clear. Neck is nontender and supple without adenopathy or JVD. Back is nontender in the midline. There is moderate left CVA tenderness. Lungs are clear without rales, wheezes, or  rhonchi. Chest is nontender. Heart has regular rate and rhythm without murmur. Abdomen is soft, flat, nontender without masses or hepatosplenomegaly and peristalsis is hypoactive. Extremities have no cyanosis or edema, full range of motion is present. Skin is warm and dry without rash. Neurologic: Mental status is normal, cranial nerves are intact, there are no motor or sensory deficits.  ED Course  Procedures (including critical care time) Labs Review Results for orders placed or performed during the hospital encounter of 10/04/14  Comprehensive metabolic panel  Result Value Ref Range   Sodium 138 135 - 145 mmol/L   Potassium 3.4 (L) 3.5 - 5.1 mmol/L   Chloride 107 101 - 111 mmol/L   CO2 24 22 - 32 mmol/L   Glucose, Bld 129 (H) 65 - 99 mg/dL   BUN 16 6 - 20 mg/dL   Creatinine, Ser 0.69 0.44 - 1.00 mg/dL   Calcium 8.8 (L) 8.9 - 10.3 mg/dL   Total Protein 7.1 6.5 - 8.1 g/dL   Albumin 4.2 3.5 - 5.0 g/dL   AST 16 15 - 41 U/L   ALT 15 14 - 54 U/L   Alkaline Phosphatase 55 38 - 126 U/L   Total Bilirubin 0.5 0.3 - 1.2 mg/dL   GFR calc non Af Amer >60 >60 mL/min   GFR calc Af Amer >60 >60 mL/min   Anion gap 7 5 - 15  Lipase, blood  Result Value Ref Range   Lipase 14 (L) 22 - 51 U/L  Urinalysis, Routine w reflex microscopic (not at [facility])  Result Value Ref Range   Color, Urine YELLOW YELLOW   APPearance HAZY (A) CLEAR   Specific Gravity, Urine >1.030 (H) 1.005 - 1.030   pH 5.5 5.0 - 8.0   Glucose, UA NEGATIVE NEGATIVE mg/dL   Hgb urine dipstick LARGE (A) NEGATIVE   Bilirubin Urine NEGATIVE NEGATIVE   Ketones, ur NEGATIVE NEGATIVE mg/dL   Protein, ur 30 (A) NEGATIVE mg/dL   Urobilinogen, UA 0.2 0.0 - 1.0 mg/dL   Nitrite NEGATIVE NEGATIVE   Leukocytes, UA NEGATIVE NEGATIVE  Pregnancy, urine  Result Value Ref Range   Preg Test, Ur NEGATIVE NEGATIVE  Urine microscopic-add on  Result Value Ref Range   Squamous Epithelial / LPF MANY (A) RARE   WBC, UA 0-2 <3 WBC/hpf   RBC /  HPF 21-50 <3 RBC/hpf   Bacteria, UA MANY (A) RARE   Crystals CA OXALATE CRYSTALS (A) NEGATIVE    Imaging Review Ct Renal Stone Study  10/04/2014   CLINICAL DATA:  Left-sided flank pain radiating to the back.  EXAM: CT ABDOMEN AND PELVIS WITHOUT CONTRAST  TECHNIQUE: Multidetector CT imaging of the abdomen and pelvis was performed following the  standard protocol without IV contrast.  COMPARISON:  None.  FINDINGS: Lower chest and abdominal wall: 25 mm mass in the 6 o'clock left breast without specific features. Masslike morphology is confirmed on reformats.  Hepatobiliary: Hepatic steatosis with presumed 1 cm cyst in the central left lobe.Cholecystectomy. No common bile duct enlargement.  Pancreas: Unremarkable.  Spleen: Unremarkable.  Adrenals/Urinary Tract: 5 x 8 mm stone in the mid left ureter with moderate hydronephrosis and proximal hydroureter. This stone is at the level of the L3-4 disc. 3 additional left renal calculi, measuring up to 6 mm. No right hydronephrosis. Negative bladder and adrenals.  Reproductive:Pelvic ultrasound from 3 days ago with no evidence of interval change.Bilateral tubal ligation.  Stomach/Bowel: No obstruction. Early colonic diverticulosis. No appendicitis.  Vascular/Lymphatic: No acute vascular abnormality. No mass or adenopathy.  Peritoneal: No ascites or pneumoperitoneum.  Musculoskeletal: No acute abnormalities.  IMPRESSION: 1. 6 x 8 mm mid left ureteral stone with moderate hydronephrosis. 2. Left nephrolithiasis up to 6 mm. 3. 25 mm left breast mass at 6 o'clock. Recommend diagnostic mammography.   Electronically Signed   By: Monte Fantasia M.D.   On: 10/04/2014 07:38   I have personally reviewed and evaluated these images and lab results as part of my medical decision-making.    MDM   Final diagnoses:  Left flank pain  Ureteral calculi  Nephrolithiasis   Left flank pain worrisome for renal colic. urinalysis has hematuria and crystal urea. Old records are  reviewed and she has no prior visits for kidney stones and has not had any imaging of her abdomen. She will be given hydromorphone for pain. She has already received ondansetron for nausea with good relief of nausea. CT scan renal stone protocol will be obtained.  Pain was controlled although she did require a second dose of hydromorphone. Nausea also required an additional injection of metoclopramide. CT shows a large calculus in the left mid ureter with additional calculi present in the left kidney. Because of size, this likely will need management by a urologist. Incidental finding of left breast mass was noted and patient is advised to obtain mammography as an outpatient. She is discharged with prescriptions for hydrocodone-acetaminophen, ondansetron, and tamsulosin. Return precautions given.  Delora Fuel, MD 24/82/50 0370

## 2014-10-04 NOTE — Discharge Instructions (Signed)
Return to the emergency department if pain is not being adequately controlled, you start running a fever, or start throwing up and cannot hold her pain medicine down. If you have to return, you will probably need to be transferred to [facility], which is where all of the urology services are located. In the meantime, do not take any ibuprofen or naproxen or similar non-steroidal anti-inflammatory drug. If the urologist once to do lithotripsy (shockwave treatment), they prefer that she not have any of his medicines in your system.  Your scan also showed a mass in the left breast. This needs to be evaluated by mammography. Please arrange for a mammogram as soon as possible.  Kidney Stones Kidney stones (urolithiasis) are deposits that form inside your kidneys. The intense pain is caused by the stone moving through the urinary tract. When the stone moves, the ureter goes into spasm around the stone. The stone is usually passed in the urine.  CAUSES   A disorder that makes certain neck glands produce too much parathyroid hormone (primary hyperparathyroidism).  A buildup of uric acid crystals, similar to gout in your joints.  Narrowing (stricture) of the ureter.  A kidney obstruction present at birth (congenital obstruction).  Previous surgery on the kidney or ureters.  Numerous kidney infections. SYMPTOMS   Feeling sick to your stomach (nauseous).  Throwing up (vomiting).  Blood in the urine (hematuria).  Pain that usually spreads (radiates) to the groin.  Frequency or urgency of urination. DIAGNOSIS   Taking a history and physical exam.  Blood or urine tests.  CT scan.  Occasionally, an examination of the inside of the urinary bladder (cystoscopy) is performed. TREATMENT   Observation.  Increasing your fluid intake.  Extracorporeal shock wave lithotripsy--This is a noninvasive procedure that uses shock waves to break up kidney stones.  Surgery may be  needed if you have severe pain or persistent obstruction. There are various surgical procedures. Most of the procedures are performed with the use of small instruments. Only small incisions are needed to accommodate these instruments, so recovery time is minimized. The size, location, and chemical composition are all important variables that will determine the proper choice of action for you. Talk to your health care provider to better understand your situation so that you will minimize the risk of injury to yourself and your kidney.  HOME CARE INSTRUCTIONS   Drink enough water and fluids to keep your urine clear or pale yellow. This will help you to pass the stone or stone fragments.  Strain all urine through the provided strainer. Keep all particulate matter and stones for your health care provider to see. The stone causing the pain may be as small as a grain of salt. It is very important to use the strainer each and every time you pass your urine. The collection of your stone will allow your health care provider to analyze it and verify that a stone has actually passed. The stone analysis will often identify what you can do to reduce the incidence of recurrences.  Only take over-the-counter or prescription medicines for pain, discomfort, or fever as directed by your health care provider.  Make a follow-up appointment with your health care provider as directed.  Get follow-up X-rays if required. The absence of pain does not always mean that the stone has passed. It may have only stopped moving. If the urine remains completely obstructed, it can cause loss of kidney function or even complete destruction of the kidney.  It is your responsibility to make sure X-rays and follow-ups are completed. Ultrasounds of the kidney can show blockages and the status of the kidney. Ultrasounds are not associated with any radiation and can be performed easily in a matter of minutes. SEEK MEDICAL CARE IF:  You  experience pain that is progressive and unresponsive to any pain medicine you have been prescribed. SEEK IMMEDIATE MEDICAL CARE IF:   Pain cannot be controlled with the prescribed medicine.  You have a fever or shaking chills.  The severity or intensity of pain increases over 18 hours and is not relieved by pain medicine.  You develop a new onset of abdominal pain.  You feel faint or pass out.  You are unable to urinate. MAKE SURE YOU:   Understand these instructions.  Will watch your condition.  Will get help right away if you are not doing well or get worse. Document Released: 01/10/2005 Document Revised: 09/12/2012 Document Reviewed: 06/13/2012 [facility] Patient Information 2015 [place], [place]. This information is not intended to replace advice given to you by your health care provider. Make sure you discuss any questions you have with your health care provider.  Acetaminophen; Hydrocodone tablets or capsules What is this medicine? ACETAMINOPHEN; HYDROCODONE (a set a MEE noe fen; hye droe KOE done) is a pain reliever. It is used to treat mild to moderate pain. This medicine may be used for other purposes; ask your health care provider or pharmacist if you have questions. COMMON BRAND NAME(S): Anexsia, Bancap HC, Ceta-Plus, Co-Gesic, Comfortpak, Dolagesic, [employer], DuoCet, Hydrocet, Hydrogesic, [place], Lorcet HD, Lorcet Plus, Lortab, Margesic H, Maxidone, [place], Polygesic, [place], [place], [place], Vicodin, Vicodin ES, Vicodin HP, Charlane Ferretti What should I tell my health care provider before I take this medicine? They need to know if you have any of these conditions: -brain tumor -Crohn's disease, inflammatory bowel disease, or ulcerative colitis -drug abuse or addiction -head injury -heart or circulation problems -if you often drink alcohol -kidney disease or problems going to the bathroom -liver disease -lung disease, asthma, or breathing problems -an unusual  or allergic reaction to acetaminophen, hydrocodone, other opioid analgesics, other medicines, foods, dyes, or preservatives -pregnant or trying to get pregnant -breast-feeding How should I use this medicine? Take this medicine by mouth. Swallow it with a full glass of water. Follow the directions on the prescription label. If the medicine upsets your stomach, take the medicine with food or milk. Do not take more than you are told to take. Talk to your pediatrician regarding the use of this medicine in children. This medicine is not approved for use in children. Overdosage: If you think you have taken too much of this medicine contact a poison control center or emergency room at once. NOTE: This medicine is only for you. Do not share this medicine with others. What if I miss a dose? If you miss a dose, take it as soon as you can. If it is almost time for your next dose, take only that dose. Do not take double or extra doses. What may interact with this medicine? -alcohol -antihistamines -isoniazid -medicines for depression, anxiety, or psychotic disturbances -medicines for sleep -muscle relaxants -naltrexone -narcotic medicines (opiates) for pain -phenobarbital -ritonavir -tramadol This list may not describe all possible interactions. Give your health care provider a list of all the medicines, herbs, non-prescription drugs, or dietary supplements you use. Also tell them if you smoke, drink alcohol, or use illegal drugs. Some items may interact with your medicine. What  should I watch for while using this medicine? Tell your doctor or health care professional if your pain does not go away, if it gets worse, or if you have new or a different type of pain. You may develop tolerance to the medicine. Tolerance means that you will need a higher dose of the medicine for pain relief. Tolerance is normal and is expected if you take the medicine for a long time. Do not suddenly stop taking your medicine  because you may develop a severe reaction. Your body becomes used to the medicine. This does NOT mean you are addicted. Addiction is a behavior related to getting and using a drug for a non-medical reason. If you have pain, you have a medical reason to take pain medicine. Your doctor will tell you how much medicine to take. If your doctor wants you to stop the medicine, the dose will be slowly lowered over time to avoid any side effects. You may get drowsy or dizzy when you first start taking the medicine or change doses. Do not drive, use machinery, or do anything that may be dangerous until you know how the medicine affects you. Stand or sit up slowly. There are different types of narcotic medicines (opiates) for pain. If you take more than one type at the same time, you may have more side effects. Give your health care provider a list of all medicines you use. Your doctor will tell you how much medicine to take. Do not take more medicine than directed. Call emergency for help if you have problems breathing. The medicine will cause constipation. Try to have a bowel movement at least every 2 to 3 days. If you do not have a bowel movement for 3 days, call your doctor or health care professional. Too much acetaminophen can be very dangerous. Do not take Tylenol (acetaminophen) or medicines that contain acetaminophen with this medicine. Many non-prescription medicines contain acetaminophen. Always read the labels carefully. What side effects may I notice from receiving this medicine? Side effects that you should report to your doctor or health care professional as soon as possible: -allergic reactions like skin rash, itching or hives, swelling of the face, lips, or tongue -breathing problems -confusion -feeling faint or lightheaded, falls -stomach pain -yellowing of the eyes or skin Side effects that usually do not require medical attention (report to your doctor or health care professional if they  continue or are bothersome): -nausea, vomiting -stomach upset This list may not describe all possible side effects. Call your doctor for medical advice about side effects. You may report side effects to FDA at 1-800-FDA-1088. Where should I keep my medicine? Keep out of the reach of children. This medicine can be abused. Keep your medicine in a safe place to protect it from theft. Do not share this medicine with anyone. Selling or giving away this medicine is dangerous and against the law. Store at room temperature between 15 and 30 degrees C (59 and 86 degrees F). Protect from light. Keep container tightly closed. Throw away any unused medicine after the expiration date. Discard unused medicine and used packaging carefully. Pets and children can be harmed if they find used or lost packages. NOTE: This sheet is a summary. It may not cover all possible information. If you have questions about this medicine, talk to your doctor, pharmacist, or health care provider.  2015, [employer] Standard. (2012-09-03 13:15:56)   Ondansetron tablets What is this medicine? ONDANSETRON (on DAN se tron) is used to treat  nausea and vomiting caused by chemotherapy. It is also used to prevent or treat nausea and vomiting after surgery. This medicine may be used for other purposes; ask your health care provider or pharmacist if you have questions. COMMON BRAND NAME(S): Zofran What should I tell my health care provider before I take this medicine? They need to know if you have any of these conditions: -heart disease -history of irregular heartbeat -liver disease -low levels of magnesium or potassium in the blood -an unusual or allergic reaction to ondansetron, granisetron, other medicines, foods, dyes, or preservatives -pregnant or trying to get pregnant -breast-feeding How should I use this medicine? Take this medicine by mouth with a glass of water. Follow the directions on your prescription label. Take  your doses at regular intervals. Do not take your medicine more often than directed. Talk to your pediatrician regarding the use of this medicine in children. Special care may be needed. Overdosage: If you think you have taken too much of this medicine contact a poison control center or emergency room at once. NOTE: This medicine is only for you. Do not share this medicine with others. What if I miss a dose? If you miss a dose, take it as soon as you can. If it is almost time for your next dose, take only that dose. Do not take double or extra doses. What may interact with this medicine? Do not take this medicine with any of the following medications: -apomorphine -certain medicines for fungal infections like fluconazole, itraconazole, ketoconazole, posaconazole, voriconazole -cisapride -dofetilide -dronedarone -pimozide -thioridazine -ziprasidone This medicine may also interact with the following medications: -carbamazepine -certain medicines for depression, anxiety, or psychotic disturbances -fentanyl -linezolid -MAOIs like Carbex, Eldepryl, Marplan, Nardil, and Parnate -methylene blue (injected into a vein) -other medicines that prolong the QT interval (cause an abnormal heart rhythm) -phenytoin -rifampicin -tramadol This list may not describe all possible interactions. Give your health care provider a list of all the medicines, herbs, non-prescription drugs, or dietary supplements you use. Also tell them if you smoke, drink alcohol, or use illegal drugs. Some items may interact with your medicine. What should I watch for while using this medicine? Check with your doctor or health care professional right away if you have any sign of an allergic reaction. What side effects may I notice from receiving this medicine? Side effects that you should report to your doctor or health care professional as soon as possible: -allergic reactions like skin rash, itching or hives, swelling of the  face, lips or tongue -breathing problems -confusion -dizziness -fast or irregular heartbeat -feeling faint or lightheaded, falls -fever and chills -loss of balance or coordination -seizures -sweating -swelling of the hands or feet -tightness in the chest -tremors -unusually weak or tired Side effects that usually do not require medical attention (report to your doctor or health care professional if they continue or are bothersome): -constipation or diarrhea -headache This list may not describe all possible side effects. Call your doctor for medical advice about side effects. You may report side effects to FDA at 1-800-FDA-1088. Where should I keep my medicine? Keep out of the reach of children. Store between 2 and 30 degrees C (36 and 86 degrees F). Throw away any unused medicine after the expiration date. NOTE: This sheet is a summary. It may not cover all possible information. If you have questions about this medicine, talk to your doctor, pharmacist, or health care provider.  2015, Elsevier/Gold Standard. (2012-10-17 16:27:45)  Tamsulosin capsules What  is this medicine? TAMSULOSIN (tam SOO loe sin) is used to treat enlargement of the prostate gland in men, a condition called benign prostatic hyperplasia or BPH. It is not for use in women. It works by relaxing muscles in the prostate and bladder neck. This improves urine flow and reduces BPH symptoms. This medicine may be used for other purposes; ask your health care provider or pharmacist if you have questions. COMMON BRAND NAME(S): Flomax What should I tell my health care provider before I take this medicine? They need to know if you have any of the following conditions: -advanced kidney disease -advanced liver disease -low blood pressure -prostate cancer -an unusual or allergic reaction to tamsulosin, sulfa drugs, other medicines, foods, dyes, or preservatives -pregnant or trying to get pregnant -breast-feeding How should  I use this medicine? Take this medicine by mouth about 30 minutes after the same meal every day. Follow the directions on the prescription label. Swallow the capsules whole with a glass of water. Do not crush, chew, or open capsules. Do not take your medicine more often than directed. Do not stop taking your medicine unless your doctor tells you to. Talk to your pediatrician regarding the use of this medicine in children. Special care may be needed. Overdosage: If you think you have taken too much of this medicine contact a poison control center or emergency room at once. NOTE: This medicine is only for you. Do not share this medicine with others. What if I miss a dose? If you miss a dose, take it as soon as you can. If it is almost time for your next dose, take only that dose. Do not take double or extra doses. If you stop taking your medicine for several days or more, ask your doctor or health care professional what dose you should start back on. What may interact with this medicine? -cimetidine -fluoxetine -ketoconazole -medicines for erectile disfunction like sildenafil, tadalafil, vardenafil -medicines for high blood pressure -other alpha-blockers like alfuzosin, doxazosin, phentolamine, phenoxybenzamine, prazosin, terazosin -warfarin This list may not describe all possible interactions. Give your health care provider a list of all the medicines, herbs, non-prescription drugs, or dietary supplements you use. Also tell them if you smoke, drink alcohol, or use illegal drugs. Some items may interact with your medicine. What should I watch for while using this medicine? Visit your doctor or health care professional for regular check ups. You will need lab work done before you start this medicine and regularly while you are taking it. Check your blood pressure as directed. Ask your health care professional what your blood pressure should be, and when you should contact him or her. This medicine may  make you feel dizzy or lightheaded. This is more likely to happen after the first dose, after an increase in dose, or during hot weather or exercise. Drinking alcohol and taking some medicines can make this worse. Do not drive, use machinery, or do anything that needs mental alertness until you know how this medicine affects you. Do not sit or stand up quickly. If you begin to feel dizzy, sit down until you feel better. These effects can decrease once your body adjusts to the medicine. Contact your doctor or health care professional right away if you have an erection that lasts longer than 4 hours or if it becomes painful. This may be a sign of a serious problem and must be treated right away to prevent permanent damage. If you are thinking of having cataract surgery, tell  your eye surgeon that you have taken this medicine. What side effects may I notice from receiving this medicine? Side effects that you should report to your doctor or health care professional as soon as possible: -allergic reactions like skin rash or itching, hives, swelling of the lips, mouth, tongue, or throat -breathing problems -change in vision -feeling faint or lightheaded -irregular heartbeat -prolonged or painful erection -weakness Side effects that usually do not require medical attention (report to your doctor or health care professional if they continue or are bothersome): -back pain -change in sex drive or performance -constipation, nausea or vomiting -cough -drowsy -runny or stuffy nose -trouble sleeping This list may not describe all possible side effects. Call your doctor for medical advice about side effects. You may report side effects to FDA at 1-800-FDA-1088. Where should I keep my medicine? Keep out of the reach of children. Store at room temperature between 15 and 30 degrees C (59 and 86 degrees F). Throw away any unused medicine after the expiration date. NOTE: This sheet is a summary. It may not cover  all possible information. If you have questions about this medicine, talk to your doctor, pharmacist, or health care provider.  2015, [employer] Standard. (2012-01-11 14:11:34)

## 2014-10-04 NOTE — ED Notes (Signed)
Pt c/o left sided flank that radiates into her back. Pt c/o n/v. Pt states this pain woke her up from her sleep.

## 2014-10-06 ENCOUNTER — Encounter (HOSPITAL_COMMUNITY): Payer: Self-pay | Admitting: Emergency Medicine

## 2014-10-06 ENCOUNTER — Other Ambulatory Visit: Payer: Self-pay | Admitting: Adult Health

## 2014-10-06 ENCOUNTER — Other Ambulatory Visit: Payer: Self-pay | Admitting: Urology

## 2014-10-06 ENCOUNTER — Telehealth: Payer: Self-pay | Admitting: Adult Health

## 2014-10-06 ENCOUNTER — Inpatient Hospital Stay (HOSPITAL_COMMUNITY)
Admission: EM | Admit: 2014-10-06 | Discharge: 2014-10-07 | DRG: 694 | Disposition: A | Discharge status: Home / Self Care | Payer: 59 | Attending: Urology | Admitting: Urology

## 2014-10-06 DIAGNOSIS — N132 Hydronephrosis with renal and ureteral calculous obstruction: Secondary | ICD-10-CM | POA: Diagnosis present

## 2014-10-06 DIAGNOSIS — IMO0002 Reserved for concepts with insufficient information to code with codable children: Secondary | ICD-10-CM

## 2014-10-06 DIAGNOSIS — Z79899 Other long term (current) drug therapy: Secondary | ICD-10-CM

## 2014-10-06 DIAGNOSIS — N63 Unspecified lump in breast: Secondary | ICD-10-CM | POA: Diagnosis present

## 2014-10-06 DIAGNOSIS — R229 Localized swelling, mass and lump, unspecified: Principal | ICD-10-CM

## 2014-10-06 DIAGNOSIS — Z8249 Family history of ischemic heart disease and other diseases of the circulatory system: Secondary | ICD-10-CM | POA: Diagnosis not present

## 2014-10-06 DIAGNOSIS — N201 Calculus of ureter: Secondary | ICD-10-CM | POA: Diagnosis present

## 2014-10-06 DIAGNOSIS — Z803 Family history of malignant neoplasm of breast: Secondary | ICD-10-CM | POA: Diagnosis not present

## 2014-10-06 DIAGNOSIS — N23 Unspecified renal colic: Secondary | ICD-10-CM | POA: Diagnosis present

## 2014-10-06 DIAGNOSIS — Z885 Allergy status to narcotic agent status: Secondary | ICD-10-CM | POA: Diagnosis not present

## 2014-10-06 DIAGNOSIS — Z801 Family history of malignant neoplasm of trachea, bronchus and lung: Secondary | ICD-10-CM | POA: Diagnosis not present

## 2014-10-06 DIAGNOSIS — Z833 Family history of diabetes mellitus: Secondary | ICD-10-CM

## 2014-10-06 DIAGNOSIS — N3010 Interstitial cystitis (chronic) without hematuria: Secondary | ICD-10-CM | POA: Diagnosis present

## 2014-10-06 LAB — BASIC METABOLIC PANEL
Anion gap: 8 (ref 5–15)
BUN: 17 mg/dL (ref 6–20)
CALCIUM: 9.6 mg/dL (ref 8.9–10.3)
CO2: 25 mmol/L (ref 22–32)
Chloride: 106 mmol/L (ref 101–111)
Creatinine, Ser: 0.64 mg/dL (ref 0.44–1.00)
GFR calc Af Amer: >60 mL/min (ref >60)
GLUCOSE: 111 mg/dL — AB (ref 65–99)
POTASSIUM: 3.6 mmol/L (ref 3.5–5.1)
Sodium: 139 mmol/L (ref 135–145)

## 2014-10-06 LAB — URINALYSIS, ROUTINE W REFLEX MICROSCOPIC
GLUCOSE, UA: NEGATIVE mg/dL
Nitrite: NEGATIVE
PH: 6 (ref 5.0–8.0)
Protein, ur: 100 mg/dL — AB
SPECIFIC GRAVITY, URINE: 1.023 (ref 1.005–1.030)
Urobilinogen, UA: 1 mg/dL (ref 0.0–1.0)

## 2014-10-06 LAB — CYTOLOGY - PAP

## 2014-10-06 LAB — CBC WITH DIFFERENTIAL/PLATELET
Basophils Absolute: 0 10*3/uL (ref 0.0–0.1)
Basophils Relative: 0 % (ref 0–1)
EOS PCT: 0 % (ref 0–5)
Eosinophils Absolute: 0 10*3/uL (ref 0.0–0.7)
HEMATOCRIT: 38.9 % (ref 36.0–46.0)
Hemoglobin: 13.2 g/dL (ref 12.0–15.0)
LYMPHS ABS: 1.2 10*3/uL (ref 0.7–4.0)
LYMPHS PCT: 9 % — AB (ref 12–46)
MCH: 30.4 pg (ref 26.0–34.0)
MCHC: 33.9 g/dL (ref 30.0–36.0)
MCV: 89.6 fL (ref 78.0–100.0)
MONO ABS: 0.6 10*3/uL (ref 0.1–1.0)
Monocytes Relative: 5 % (ref 3–12)
Neutro Abs: 11.6 10*3/uL — ABNORMAL HIGH (ref 1.7–7.7)
Neutrophils Relative %: 86 % — ABNORMAL HIGH (ref 43–77)
PLATELETS: 253 10*3/uL (ref 150–400)
RBC: 4.34 MIL/uL (ref 3.87–5.11)
RDW: 11.9 % (ref 11.5–15.5)
WBC: 13.4 10*3/uL — ABNORMAL HIGH (ref 4.0–10.5)

## 2014-10-06 LAB — URINE MICROSCOPIC-ADD ON

## 2014-10-06 MED ORDER — FENTANYL CITRATE (PF) 100 MCG/2ML IJ SOLN
50.0000 ug | Freq: Once | INTRAMUSCULAR | Status: DC
  Filled 2014-10-06: qty 2

## 2014-10-06 MED ORDER — SODIUM CHLORIDE 0.9 % IV BOLUS (SEPSIS)
1000.0000 mL | Freq: Once | INTRAVENOUS | Status: AC
  Administered 2014-10-07: 1000 mL via INTRAVENOUS

## 2014-10-06 MED ORDER — ONDANSETRON 4 MG PO TBDP
4.0000 mg | ORAL_TABLET | Freq: Once | ORAL | Status: AC | PRN
  Administered 2014-10-06: 4 mg via ORAL
  Filled 2014-10-06: qty 1

## 2014-10-06 MED ORDER — HYDROMORPHONE HCL 1 MG/ML IJ SOLN
1.0000 mg | Freq: Once | INTRAMUSCULAR | Status: AC
  Administered 2014-10-06: 1 mg via INTRAVENOUS
  Filled 2014-10-06: qty 1

## 2014-10-06 NOTE — ED Provider Notes (Signed)
CSN: [id number]     Arrival date & time 10/06/14  1811 History   First MD Initiated Contact with Patient 10/06/14 2143     No chief complaint on file.    (Consider location/radiation/quality/duration/timing/severity/associated sxs/prior Treatment) HPI MELAT WRISLEY is a 39 y.o. female history of interstitial cystitis, presents to emergency department complaining of left flank pain, nausea, vomiting. Patient states she was seen 2 days ago, day after pain started, diagnosed with a kidney stone. She followed up with Dr. Jeffie Pollock with urology today and was scheduled for lithotripsy for Thursday. Patient states pain comes and goes. She states she is unable to eat or drink anything due to nausea and vomiting. She states that she has tried Zofran and Phenergan with no improvement in her nausea. She reports she has been taking Norco for the pain with no relief. She states pain is 10 out of 10 at this time. She reports greater than 10 episodes of emesis in the last 24 hours. She states she is unable to keep anything down. She called on-call urology and they told to come to emergency department. She denies any fever or chills. No other complaints.  Past Medical History  Diagnosis Date  . PONV (postoperative nausea and vomiting)   . Herpes simplex without mention of complication   . BV (bacterial vaginosis) 05/02/2012  . Hematuria 12/18/2012  . UTI (lower urinary tract infection) 12/18/2012  . Interstitial cystitis   . Cancer     skin  . Pelvic pain in female 09/15/2014  . Abnormal uterine bleeding (AUB) 09/15/2014   Past Surgical History  Procedure Laterality Date  . Tubal ligation    . Cesarean section    . Tonsillectomy and adenoidectomy    . Cholecystectomy  16 years ago    [facility]  . Dilitation & currettage/hystroscopy with thermachoice ablation N/A 03/09/2012    Procedure: DILATATION & CURETTAGE/HYSTEROSCOPY WITH THERMACHOICE ABLATION;  Surgeon: Florian Buff, MD;  Location: [facility];  Service:  Gynecology;  Laterality: N/A;  D5W in -61ml,out 4ml, Total therapy time:65min 39sec    Family History  Problem Relation Age of Onset  . Hypertension Mother   . Heart disease Mother   . Hypertension Father   . Heart disease Father   . Heart attack Father   . Lung cancer Father   . Cancer Father   . Cancer Maternal Grandmother     breast  . Diabetes Paternal Grandmother   . Cancer Paternal Grandfather     lung  . Colon cancer Neg Hx   . Heart attack Brother   . Hypertension Brother   . Hyperlipidemia Brother   . Other Brother     brain tumor   Social History  Substance Use Topics  . Smoking status: Never Smoker   . Smokeless tobacco: Never Used  . Alcohol Use: No   OB History    Gravida Para Term Preterm AB TAB SAB Ectopic Multiple Living   3 3        3      Review of Systems  Constitutional: Negative for fever and chills.  Respiratory: Negative for cough, chest tightness and shortness of breath.   Cardiovascular: Negative for chest pain, palpitations and leg swelling.  Gastrointestinal: Positive for nausea, vomiting and abdominal pain. Negative for diarrhea.  Genitourinary: Positive for flank pain. Negative for dysuria, vaginal bleeding, vaginal discharge, vaginal pain and pelvic pain.  Musculoskeletal: Negative for myalgias, arthralgias, neck pain and neck stiffness.  Skin: Negative for rash.  Neurological: Negative for dizziness, weakness and headaches.  All other systems reviewed and are negative.     Allergies  Codeine  Home Medications   Prior to Admission medications   Medication Sig Start Date End Date Taking? Authorizing Provider  cetirizine (ZYRTEC) 10 MG tablet Take 10 mg by mouth daily as needed for allergies.    Yes Historical Provider, MD  ELMIRON 100 MG capsule TAKE 2 CAPSULES BY MOUTH TWICE DAILY 02/03/14  Yes Florian Buff, MD  Linaclotide [facility]) 145 MCG CAPS capsule Take 1 capsule (145 mcg total) by mouth daily. At least 30 minutes before  breakfast 06/05/14  Yes Orvil Feil, NP  omeprazole (PRILOSEC) 20 MG capsule Take 20 mg by mouth daily as needed (acid reflux).    Yes Historical Provider, MD  ondansetron (ZOFRAN) 4 MG tablet Take 1 tablet (4 mg total) by mouth every 6 (six) hours as needed. Patient taking differently: Take 4 mg by mouth every 6 (six) hours as needed for nausea.  10/16/28  Yes Delora Fuel, MD  tamsulosin (FLOMAX) 0.4 MG CAPS capsule Take 1 capsule (0.4 mg total) by mouth daily. 0/76/22  Yes Delora Fuel, MD  DOXYCYCLINE PO Take by mouth. 10 day course completed about 09/29/14    Historical Provider, MD  HYDROcodone-acetaminophen (NORCO) 5-325 MG per tablet Take 1 tablet by mouth every 4 (four) hours as needed for moderate pain. Patient not taking: Reported on 10/06/2014 6/33/35   Delora Fuel, MD   BP 456/25 mmHg  Pulse 68  Temp(Src) 98.5 F (36.9 C) (Oral)  Resp 18  SpO2 99%  LMP 09/13/2014 Physical Exam  Constitutional: She is oriented to person, place, and time. She appears well-developed and well-nourished. No distress.  HENT:  Head: Normocephalic.  Eyes: Conjunctivae are normal.  Neck: Neck supple.  Cardiovascular: Normal rate, regular rhythm and normal heart sounds.   Pulmonary/Chest: Effort normal and breath sounds normal. No respiratory distress. She has no wheezes. She has no rales.  Abdominal: Soft. Bowel sounds are normal. She exhibits no distension. There is no tenderness. There is no rebound.  Left CVA tenderness, left lower abdominal tenderness  Musculoskeletal: She exhibits no edema.  Neurological: She is alert and oriented to person, place, and time.  Skin: Skin is warm and dry.  Psychiatric: She has a normal mood and affect. Her behavior is normal.  Nursing note and vitals reviewed.   ED Course  Procedures (including critical care time) Labs Review Labs Reviewed  CBC WITH DIFFERENTIAL/PLATELET - Abnormal; Notable for the following:    WBC 13.4 (*)    Neutrophils Relative % 86 (*)     Neutro Abs 11.6 (*)    Lymphocytes Relative 9 (*)    All other components within normal limits  BASIC METABOLIC PANEL - Abnormal; Notable for the following:    Glucose, Bld 111 (*)    All other components within normal limits  URINALYSIS, ROUTINE W REFLEX MICROSCOPIC (NOT AT [facility]) - Abnormal; Notable for the following:    Color, Urine RED (*)    APPearance TURBID (*)    Hgb urine dipstick LARGE (*)    Bilirubin Urine MODERATE (*)    Ketones, ur >80 (*)    Protein, ur 100 (*)    Leukocytes, UA MODERATE (*)    All other components within normal limits  URINE CULTURE  URINE MICROSCOPIC-ADD ON    Imaging Review No results found. I have personally reviewed and evaluated these images and lab results as part  of my medical decision-making.   EKG Interpretation None      MDM   Final diagnoses:  Renal colic on left side    Patient emergency department with known kidney stone. Was seen by urology today. She states she is here because her pain is not controlled or she is unable to keep any fluids or solids down. Patient is afebrile, normal vital signs. Will check renal function, UA, pain medications ordered.  12:00 AM Patient is feeling much better. Her pain is under control. She is worried about going home, due to persistent vomiting. She states that she has had no relief of nausea with Phenergan or Zofran at home. I spoke with Dr.Wrenn who agreed to admit patient and will see her in the morning. I will place holding orders overnight. Patient is to be NPO.   Filed Vitals:   10/06/14 1843 10/06/14 2249  BP: 135/79 132/75  Pulse: 68 72  Temp: 98.5 F (36.9 C) 98 F (36.7 C)  TempSrc: Oral Oral  Resp: 18 18  SpO2: 99% 99%       Jeannett Senior, PA-C 10/07/14 0001  Lacretia Leigh, MD 10/07/14 2344

## 2014-10-06 NOTE — Telephone Encounter (Signed)
Called pt to let her know I saw CT and that she has kidney stone and breast mass, getting mammogram tomorrow and sees urology doctor Thursday, wished her luck.

## 2014-10-06 NOTE — ED Notes (Signed)
Pt recently seen by urologist for kidney stone. Unable to keep PO medications down. C/o left flank pain, stabbing in nature, 10/10 pain. 10+ episodes of emesis over 24 hours, pt states emesis is positive for "medium" colored blood. Also reports decreased UO.

## 2014-10-07 ENCOUNTER — Encounter (HOSPITAL_COMMUNITY): Payer: Self-pay | Admitting: *Deleted

## 2014-10-07 ENCOUNTER — Inpatient Hospital Stay (HOSPITAL_COMMUNITY): Payer: 59 | Admitting: Anesthesiology

## 2014-10-07 ENCOUNTER — Encounter (HOSPITAL_COMMUNITY): Payer: 59

## 2014-10-07 ENCOUNTER — Encounter (HOSPITAL_COMMUNITY): Payer: Self-pay | Admitting: Anesthesiology

## 2014-10-07 ENCOUNTER — Encounter (HOSPITAL_COMMUNITY)
Admission: EM | Disposition: A | Discharge status: Home / Self Care | Payer: Self-pay | Source: Home / Self Care | Attending: Urology

## 2014-10-07 HISTORY — PX: CYSTOSCOPY WITH RETROGRADE PYELOGRAM, URETEROSCOPY AND STENT PLACEMENT: SHX5789

## 2014-10-07 LAB — MRSA PCR SCREENING: MRSA BY PCR: NEGATIVE

## 2014-10-07 SURGERY — CYSTOURETEROSCOPY, WITH RETROGRADE PYELOGRAM AND STENT INSERTION
Anesthesia: General | Site: Ureter | Laterality: Left

## 2014-10-07 MED ORDER — SCOPOLAMINE 1 MG/3DAYS TD PT72
MEDICATED_PATCH | TRANSDERMAL | Status: DC | PRN
  Administered 2014-10-07: 1 via TRANSDERMAL

## 2014-10-07 MED ORDER — PROPOFOL 10 MG/ML IV BOLUS
INTRAVENOUS | Status: DC | PRN
  Administered 2014-10-07: 150 mg via INTRAVENOUS

## 2014-10-07 MED ORDER — CIPROFLOXACIN IN D5W 400 MG/200ML IV SOLN
400.0000 mg | INTRAVENOUS | Status: AC
  Administered 2014-10-07: 400 mg via INTRAVENOUS

## 2014-10-07 MED ORDER — ONDANSETRON HCL 4 MG/2ML IJ SOLN
INTRAMUSCULAR | Status: DC | PRN
  Administered 2014-10-07: 4 mg via INTRAVENOUS

## 2014-10-07 MED ORDER — SODIUM CHLORIDE 0.9 % IV SOLN: INTRAVENOUS | Status: DC

## 2014-10-07 MED ORDER — 0.9 % SODIUM CHLORIDE (POUR BTL) OPTIME
TOPICAL | Status: DC | PRN
  Administered 2014-10-07: 1000 mL

## 2014-10-07 MED ORDER — LACTATED RINGERS IV SOLN
INTRAVENOUS | Status: DC | PRN
  Administered 2014-10-07: 17:00:00 via INTRAVENOUS

## 2014-10-07 MED ORDER — ONDANSETRON HCL 4 MG PO TABS: 4.0000 mg | ORAL_TABLET | Freq: Four times a day (QID) | ORAL | Status: DC | PRN

## 2014-10-07 MED ORDER — SODIUM CHLORIDE 0.9 % IR SOLN
Status: DC | PRN
  Administered 2014-10-07: 4000 mL

## 2014-10-07 MED ORDER — LIDOCAINE HCL (CARDIAC) 20 MG/ML IV SOLN
INTRAVENOUS | Status: DC | PRN
  Administered 2014-10-07: 50 mg via INTRAVENOUS

## 2014-10-07 MED ORDER — PROMETHAZINE HCL 25 MG/ML IJ SOLN
INTRAMUSCULAR | Status: AC
  Filled 2014-10-07: qty 1

## 2014-10-07 MED ORDER — CIPROFLOXACIN IN D5W 400 MG/200ML IV SOLN
INTRAVENOUS | Status: AC
  Filled 2014-10-07: qty 200

## 2014-10-07 MED ORDER — SODIUM CHLORIDE 0.9 % IV SOLN
INTRAVENOUS | Status: AC
  Administered 2014-10-07: 03:00:00 via INTRAVENOUS

## 2014-10-07 MED ORDER — OXYCODONE-ACETAMINOPHEN 5-325 MG PO TABS: 1.0000 | ORAL_TABLET | Freq: Four times a day (QID) | ORAL | Status: DC | PRN

## 2014-10-07 MED ORDER — PHENAZOPYRIDINE HCL 200 MG PO TABS: 200.0000 mg | ORAL_TABLET | Freq: Three times a day (TID) | ORAL | Status: DC | PRN

## 2014-10-07 MED ORDER — OXYBUTYNIN CHLORIDE ER 10 MG PO TB24: 10.0000 mg | ORAL_TABLET | Freq: Every day | ORAL | Status: DC

## 2014-10-07 MED ORDER — ONDANSETRON HCL 4 MG/2ML IJ SOLN
4.0000 mg | Freq: Three times a day (TID) | INTRAMUSCULAR | Status: AC | PRN
  Administered 2014-10-07 (×2): 4 mg via INTRAVENOUS
  Filled 2014-10-07 (×3): qty 2

## 2014-10-07 MED ORDER — FENTANYL CITRATE (PF) 100 MCG/2ML IJ SOLN
INTRAMUSCULAR | Status: DC | PRN
  Administered 2014-10-07 (×3): 50 ug via INTRAVENOUS

## 2014-10-07 MED ORDER — FENTANYL CITRATE (PF) 250 MCG/5ML IJ SOLN
INTRAMUSCULAR | Status: AC
  Filled 2014-10-07: qty 25

## 2014-10-07 MED ORDER — HYDROMORPHONE HCL 1 MG/ML IJ SOLN: 0.2500 mg | INTRAMUSCULAR | Status: DC | PRN

## 2014-10-07 MED ORDER — MIDAZOLAM HCL 2 MG/2ML IJ SOLN
INTRAMUSCULAR | Status: AC
  Filled 2014-10-07: qty 4

## 2014-10-07 MED ORDER — HYDROMORPHONE HCL 1 MG/ML IJ SOLN
INTRAMUSCULAR | Status: AC
  Filled 2014-10-07: qty 1

## 2014-10-07 MED ORDER — HYDROMORPHONE HCL 1 MG/ML IJ SOLN
0.2500 mg | INTRAMUSCULAR | Status: DC | PRN
  Administered 2014-10-07: 0.5 mg via INTRAVENOUS

## 2014-10-07 MED ORDER — LACTATED RINGERS IV SOLN: INTRAVENOUS | Status: DC

## 2014-10-07 MED ORDER — PROMETHAZINE HCL 25 MG/ML IJ SOLN: 6.2500 mg | INTRAMUSCULAR | Status: DC | PRN

## 2014-10-07 MED ORDER — MIDAZOLAM HCL 5 MG/5ML IJ SOLN
INTRAMUSCULAR | Status: DC | PRN
  Administered 2014-10-07: 2 mg via INTRAVENOUS

## 2014-10-07 MED ORDER — PROPOFOL 10 MG/ML IV BOLUS
INTRAVENOUS | Status: AC
  Filled 2014-10-07: qty 20

## 2014-10-07 MED ORDER — PROMETHAZINE HCL 25 MG/ML IJ SOLN
6.2500 mg | INTRAMUSCULAR | Status: DC | PRN
Start: 2014-10-07 — End: 2014-10-07
  Administered 2014-10-07: 6.25 mg via INTRAVENOUS

## 2014-10-07 MED ORDER — HYDROMORPHONE HCL 1 MG/ML IJ SOLN
1.0000 mg | INTRAMUSCULAR | Status: AC | PRN
  Administered 2014-10-07 (×2): 1 mg via INTRAVENOUS
  Filled 2014-10-07 (×3): qty 1

## 2014-10-07 MED ORDER — IOHEXOL 300 MG/ML  SOLN
INTRAMUSCULAR | Status: DC | PRN
  Administered 2014-10-07: 1 mL via URETHRAL

## 2014-10-07 MED ORDER — DEXAMETHASONE SODIUM PHOSPHATE 10 MG/ML IJ SOLN
INTRAMUSCULAR | Status: DC | PRN
  Administered 2014-10-07: 10 mg via INTRAVENOUS

## 2014-10-07 MED ORDER — SCOPOLAMINE 1 MG/3DAYS TD PT72
MEDICATED_PATCH | TRANSDERMAL | Status: AC
  Filled 2014-10-07: qty 1

## 2014-10-07 SURGICAL SUPPLY — 14 items
BAG URO CATCHER STRL LF (DRAPE) ×2 IMPLANT
BASKET LASER NITINOL 1.9FR (BASKET) IMPLANT
BASKET STNLS GEMINI 4WIRE 3FR (BASKET) IMPLANT
CATH INTERMIT  6FR 70CM (CATHETERS) IMPLANT
EXTRACTOR STONE NITINOL NGAGE (UROLOGICAL SUPPLIES) IMPLANT
GLOVE BIO SURGEON STRL SZ8 (GLOVE) ×2 IMPLANT
GOWN STRL REUS W/TWL LRG LVL3 (GOWN DISPOSABLE) ×2 IMPLANT
GUIDEWIRE ANG ZIPWIRE 038X150 (WIRE) IMPLANT
GUIDEWIRE STR DUAL SENSOR (WIRE) ×2 IMPLANT
MANIFOLD NEPTUNE II (INSTRUMENTS) ×2 IMPLANT
PACK CYSTO (CUSTOM PROCEDURE TRAY) ×2 IMPLANT
STENT URET 6FRX24 CONTOUR (STENTS) ×2 IMPLANT
TUBE FEEDING 8FR 16IN STR KANG (MISCELLANEOUS) IMPLANT
TUBING CONNECTING 10 (TUBING) ×2 IMPLANT

## 2014-10-07 NOTE — Op Note (Signed)
.  Preoperative diagnosis: Left ureteral stone  Postoperative diagnosis: Same  Procedure: 1 cystoscopy 2. Left retrograde pyelography 3.  Intraoperative fluoroscopy, under one hour, with interpretation 4. Left 6 x 24 JJ stent placement 5. Left distal ureteroscopy  Attending: Nicolette Bang  Anesthesia: General  Estimated blood loss: None  Drains: Left 6 x 26 JJ ureteral stent without tether  Specimens: none  Antibiotics: ancef  Findings: left mid ureteral stone. Moderate hydronephrosis. No masses/lesions in the bladder. Ureteral orifices in normal anatomic location.  Indications: Patient is a 39 year old female with a history of left renal stone and persistent pain.  After discussing treatment options, they decided proceed with left ureteroscopy and stent placement.  Procedure her in detail: The patient was brought to the operating room and a brief timeout was done to ensure correct patient, correct procedure, correct site.  General anesthesia was administered patient was placed in dorsal lithotomy position.  Their genitalia was then prepped and draped in usual sterile fashion.  A rigid 21 French cystoscope was passed in the urethra and the bladder.  Bladder was inspected free masses or lesions.  the ureteral orifices were in the normal orthotopic locations.  a 6 french ureteral catheter was then instilled into the left ureteral orifice.  a gentle retrograde was obtained and findings noted above.  we then placed a zip wire through the ureteral catheter and advanced up to the renal pelvis.  We then cannulated the left ureter with a semirigid ureteroscope. Due to the small caliber of the mid ureter we could not advance the scope any further. The decision was made to place a stent.   We then placed a 6 x 24 double-j ureteral stent over the original zip wire.  We then removed the wire and good coil was noted in the the renal pelvis under fluoroscopy and the bladder under direct vision.  the  bladder was then drained and this concluded the procedure which was well tolerated by patient.  Complications: None  Condition: Stable, extubated, transferred to PACU  Plan: Patient is to be discharged home. She will proceed with her scheduled ESWL.

## 2014-10-07 NOTE — Brief Op Note (Signed)
10/06/2014 - 10/07/2014  5:33 PM  PATIENT:  Marissa Trevino  39 y.o. female  PRE-OPERATIVE DIAGNOSIS:  LEFT STONE PAIN  POST-OPERATIVE DIAGNOSIS:  LEFT STONE PAIN  PROCEDURE:  Procedure(s): CYSTOSCOPY WITH LEFT RETROGRADE PYELOGRAM, URETEROSCOPY AND LEFT STENT PLACEMENT WITH POSSIBLE HOLMIUM LASER (Left)  SURGEON:  Surgeon(s) and Role:    * Cleon Gustin, MD - Primary  PHYSICIAN ASSISTANT:   ASSISTANTS: none   ANESTHESIA:   general  EBL:  Total I/O In: 1360.4 [I.V.:1360.4] Out: 251 [Urine:250; Emesis/NG output:1]  BLOOD ADMINISTERED:none  DRAINS: left 6x24 stent  LOCAL MEDICATIONS USED:  NONE  SPECIMEN:  No Specimen  DISPOSITION OF SPECIMEN:  N/A  COUNTS:  YES  TOURNIQUET:  * No tourniquets in log *  DICTATION: .Note written in EPIC  PLAN OF CARE: Discharge to home after PACU  PATIENT DISPOSITION:  PACU - hemodynamically stable.   Delay start of Pharmacological VTE agent (>24hrs) due to surgical blood loss or risk of bleeding: not applicable

## 2014-10-07 NOTE — Anesthesia Postprocedure Evaluation (Signed)
  Anesthesia Post-op Note  Patient: Marissa Trevino  Procedure(s) Performed: Procedure(s): CYSTOSCOPY WITH LEFT RETROGRADE PYELOGRAM, URETEROSCOPY AND LEFT STENT PLACEMENT (Left)  Patient Location: PACU  Anesthesia Type:General  Level of Consciousness: awake and alert   Airway and Oxygen Therapy: Patient Spontanous Breathing  Post-op Pain: mild  Post-op Assessment: Post-op Vital signs reviewed              Post-op Vital Signs: stable  Last Vitals:  Filed Vitals:   10/07/14 1815  BP: 124/73  Pulse: 68  Temp:   Resp: 16    Complications: No apparent anesthesia complications

## 2014-10-07 NOTE — Anesthesia Preprocedure Evaluation (Addendum)
Anesthesia Evaluation  Patient identified by MRN, date of birth, ID band Patient awake    Reviewed: Allergy & Precautions, NPO status , Patient's Chart, lab work & pertinent test results  History of Anesthesia Complications (+) PONV and history of anesthetic complications  Airway Mallampati: II  TM Distance: >3 FB Neck ROM: Full    Dental  (+) Teeth Intact   Pulmonary neg pulmonary ROS,    breath sounds clear to auscultation       Cardiovascular negative cardio ROS   Rhythm:Regular Rate:Normal     Neuro/Psych negative neurological ROS     GI/Hepatic Neg liver ROS, GERD  ,  Endo/Other  negative endocrine ROS  Renal/GU Renal diseaseRenal colic, stone     Musculoskeletal  (+) Arthritis ,   Abdominal   Peds  Hematology negative hematology ROS (+)   Anesthesia Other Findings   Reproductive/Obstetrics                            Anesthesia Physical Anesthesia Plan  ASA: II  Anesthesia Plan: General   Post-op Pain Management:    Induction: Intravenous  Airway Management Planned: Oral ETT  Additional Equipment:   Intra-op Plan:   Post-operative Plan: Extubation in OR  Informed Consent: I have reviewed the patients History and Physical, chart, labs and discussed the procedure including the risks, benefits and alternatives for the proposed anesthesia with the patient or authorized representative who has indicated his/her understanding and acceptance.   Dental advisory given  Plan Discussed with: CRNA and Surgeon  Anesthesia Plan Comments:         Anesthesia Quick Evaluation

## 2014-10-07 NOTE — Transfer of Care (Signed)
Immediate Anesthesia Transfer of Care Note  Patient: Marissa Trevino  Procedure(s) Performed: Procedure(s): CYSTOSCOPY WITH LEFT RETROGRADE PYELOGRAM, URETEROSCOPY AND LEFT STENT PLACEMENT (Left)  Patient Location: PACU  Anesthesia Type:General  Level of Consciousness:  sedated, patient cooperative and responds to stimulation  Airway & Oxygen Therapy:Patient Spontanous Breathing and Patient connected to face mask oxgen  Post-op Assessment:  Report given to PACU RN and Post -op Vital signs reviewed and stable  Post vital signs:  Reviewed and stable  Last Vitals:  Filed Vitals:   10/07/14 1435  BP: 130/72  Pulse: 61  Temp: 36.8 C  Resp: 16    Complications: No apparent anesthesia complications

## 2014-10-07 NOTE — Anesthesia Procedure Notes (Signed)

## 2014-10-07 NOTE — H&P (Signed)
Subjective: Marissa Trevino was seen in the office yesterday for a 6x29mm left proximal stone with obstruction and small left renal stones.   She was scheduled for elective ESWL this Thursday but returned to the ER last night with worsening pain and intractable nausea with vomiting and was felt to require admission.   She continues to require pain med and has nausea this morning.  ROS:  Review of Systems  Constitutional: Negative for fever and chills.  Gastrointestinal: Positive for nausea and vomiting.  Genitourinary: Positive for flank pain. Negative for urgency and hematuria.  All other systems reviewed and are negative.   Allergies  Allergen Reactions  . Codeine Nausea And Vomiting    Past Medical History  Diagnosis Date  . PONV (postoperative nausea and vomiting)   . Herpes simplex without mention of complication   . BV (bacterial vaginosis) 05/02/2012  . Hematuria 12/18/2012  . UTI (lower urinary tract infection) 12/18/2012  . Interstitial cystitis   . Cancer     skin  . Pelvic pain in female 09/15/2014  . Abnormal uterine bleeding (AUB) 09/15/2014    Past Surgical History  Procedure Laterality Date  . Tubal ligation    . Cesarean section    . Tonsillectomy and adenoidectomy    . Cholecystectomy  16 years ago    [facility]  . Dilitation & currettage/hystroscopy with thermachoice ablation N/A 03/09/2012    Procedure: DILATATION & CURETTAGE/HYSTEROSCOPY WITH THERMACHOICE ABLATION;  Surgeon: Florian Buff, MD;  Location: [facility];  Service: Gynecology;  Laterality: N/A;  D5W in -38ml,out 36ml, Total therapy time:66min 39sec     Social History   Social History  . Marital Status: Single    Spouse Name: N/A  . Number of Children: N/A  . Years of Education: N/A   Occupational History  . nurse tech     [employer]   Social History Main Topics  . Smoking status: Never Smoker   . Smokeless tobacco: Never Used  . Alcohol Use: No  . Drug Use: No  . Sexual Activity: Yes    Birth  Control/ Protection: Surgical     Comment: tubal   Other Topics Concern  . Not on file   Social History Narrative    Family History  Problem Relation Age of Onset  . Hypertension Mother   . Heart disease Mother   . Hypertension Father   . Heart disease Father   . Heart attack Father   . Lung cancer Father   . Cancer Father   . Cancer Maternal Grandmother     breast  . Diabetes Paternal Grandmother   . Cancer Paternal Grandfather     lung  . Colon cancer Neg Hx   . Heart attack Brother   . Hypertension Brother   . Hyperlipidemia Brother   . Other Brother     brain tumor    Anti-infectives: Anti-infectives    None      Current Facility-Administered Medications  Medication Dose Route Frequency Provider Last Rate Last Dose  . 0.9 %  sodium chloride infusion   Intravenous STAT Tatyana Kirichenko, PA-C 125 mL/hr at 10/07/14 0240    . HYDROmorphone (DILAUDID) injection 1 mg  1 mg Intravenous Q2H PRN Tatyana Kirichenko, PA-C   1 mg at 10/07/14 8527  . ondansetron (ZOFRAN) injection 4 mg  4 mg Intravenous Q8H PRN Tatyana Kirichenko, PA-C   4 mg at 10/07/14 7824     Objective: Vital signs in last 24 hours: Temp:  [  98 F (36.7 C)-98.7 F (37.1 C)] 98.7 F (37.1 C) (09/13 0617) Pulse Rate:  [68-83] 83 (09/13 0617) Resp:  [16-18] 16 (09/13 0617) BP: (118-135)/(59-95) 124/65 mmHg (09/13 0617) SpO2:  [98 %-100 %] 98 % (09/13 0617) Weight:  [86.183 kg (190 lb)] 86.183 kg (190 lb) (09/13 0245)  Intake/Output from previous day: 09/12 0701 - 09/13 0700 In: 1000 [IV Piggyback:1000] Out: -  Intake/Output this shift:     Physical Exam  Constitutional: She is oriented to person, place, and time and well-developed, well-nourished, and in no distress.  HENT:  Head: Normocephalic and atraumatic.  Neck: Normal range of motion. Neck supple.  Cardiovascular: Normal rate, regular rhythm and normal heart sounds.   Pulmonary/Chest: Effort normal and breath sounds normal. No  respiratory distress.  Abdominal: Soft. There is tenderness (LUQ and LCVAT).  Musculoskeletal: Normal range of motion. She exhibits no edema or tenderness.  Lymphadenopathy:    She has no cervical adenopathy.  Neurological: She is alert and oriented to person, place, and time.  Skin: Skin is warm and dry.  Psychiatric: Mood and affect normal.    Lab Results:   Recent Labs  10/06/14 2210  WBC 13.4*  HGB 13.2  HCT 38.9  PLT 253   BMET  Recent Labs  10/06/14 2210  NA 139  K 3.6  CL 106  CO2 25  GLUCOSE 111*  BUN 17  CREATININE 0.64  CALCIUM 9.6   PT/INR No results for input(s): LABPROT, INR in the last 72 hours. ABG No results for input(s): PHART, HCO3 in the last 72 hours.  Invalid input(s): PCO2, PO2  Studies/Results: Recent CT films and KUB done yesterday reviewed.    Assessment: Left ureteral stone with pain, nausea and vomiting.   She is going to need cystoscopy with possible ureteroscopy and left stent insertion today and may still need ESWL on Thursday.   I reviewed the risks of the procedure including bleeding, infection, ureteral injury, need for secondary procedures, thrombotic events and anesthetic complications.     I will get this scheduled later today.      Yuleni Burich J 10/07/2014 [phone]

## 2014-10-08 ENCOUNTER — Telehealth: Payer: Self-pay | Admitting: Adult Health

## 2014-10-08 ENCOUNTER — Encounter (HOSPITAL_COMMUNITY): Payer: Self-pay | Admitting: Urology

## 2014-10-08 LAB — URINE CULTURE

## 2014-10-08 NOTE — Telephone Encounter (Signed)
Pt aware of pap, being ASCUS with +HPV, needs colpo, will make appt

## 2014-10-09 ENCOUNTER — Encounter (HOSPITAL_COMMUNITY): Payer: Self-pay

## 2014-10-09 ENCOUNTER — Ambulatory Visit (HOSPITAL_COMMUNITY): Payer: 59

## 2014-10-09 ENCOUNTER — Encounter (HOSPITAL_COMMUNITY)
Admission: RE | Disposition: A | Discharge status: Home / Self Care | Payer: Self-pay | Source: Ambulatory Visit | Attending: Urology

## 2014-10-09 ENCOUNTER — Ambulatory Visit (HOSPITAL_COMMUNITY)
Admission: RE | Admit: 2014-10-09 | Discharge: 2014-10-09 | Disposition: A | Discharge status: Home / Self Care | Payer: 59 | Source: Ambulatory Visit | Attending: Urology | Admitting: Urology

## 2014-10-09 DIAGNOSIS — M1990 Unspecified osteoarthritis, unspecified site: Secondary | ICD-10-CM | POA: Insufficient documentation

## 2014-10-09 DIAGNOSIS — K219 Gastro-esophageal reflux disease without esophagitis: Secondary | ICD-10-CM | POA: Diagnosis not present

## 2014-10-09 DIAGNOSIS — E669 Obesity, unspecified: Secondary | ICD-10-CM | POA: Diagnosis not present

## 2014-10-09 DIAGNOSIS — Z79899 Other long term (current) drug therapy: Secondary | ICD-10-CM | POA: Insufficient documentation

## 2014-10-09 DIAGNOSIS — R109 Unspecified abdominal pain: Secondary | ICD-10-CM | POA: Diagnosis present

## 2014-10-09 DIAGNOSIS — N201 Calculus of ureter: Secondary | ICD-10-CM

## 2014-10-09 DIAGNOSIS — Z79891 Long term (current) use of opiate analgesic: Secondary | ICD-10-CM | POA: Diagnosis not present

## 2014-10-09 DIAGNOSIS — N202 Calculus of kidney with calculus of ureter: Secondary | ICD-10-CM | POA: Diagnosis not present

## 2014-10-09 DIAGNOSIS — Z6834 Body mass index (BMI) 34.0-34.9, adult: Secondary | ICD-10-CM | POA: Diagnosis not present

## 2014-10-09 HISTORY — PX: LITHOTRIPSY: SUR834

## 2014-10-09 HISTORY — DX: Chronic kidney disease, unspecified: N18.9

## 2014-10-09 HISTORY — DX: Unspecified osteoarthritis, unspecified site: M19.90

## 2014-10-09 SURGERY — LITHOTRIPSY, ESWL
Anesthesia: LOCAL | Laterality: Left

## 2014-10-09 MED ORDER — SODIUM CHLORIDE 0.9 % IV SOLN
INTRAVENOUS | Status: DC
  Administered 2014-10-09: 10:00:00 via INTRAVENOUS

## 2014-10-09 MED ORDER — DIAZEPAM 5 MG PO TABS
10.0000 mg | ORAL_TABLET | ORAL | Status: AC
  Administered 2014-10-09: 10 mg via ORAL
  Filled 2014-10-09: qty 2

## 2014-10-09 MED ORDER — CIPROFLOXACIN HCL 500 MG PO TABS
500.0000 mg | ORAL_TABLET | ORAL | Status: AC
  Administered 2014-10-09: 500 mg via ORAL
  Filled 2014-10-09: qty 1

## 2014-10-09 MED ORDER — DIPHENHYDRAMINE HCL 25 MG PO CAPS
25.0000 mg | ORAL_CAPSULE | ORAL | Status: AC
  Administered 2014-10-09: 25 mg via ORAL
  Filled 2014-10-09: qty 1

## 2014-10-09 NOTE — Discharge Summary (Signed)
Physician Discharge Summary  Patient ID: Marissa Trevino MRN: [id number] DOB/AGE: Mar 08, 1975 39 y.o.  Admit date: 10/06/2014 Discharge date: 10/07/2014 Admission Diagnoses: left ureteral stone  Discharge Diagnoses:  Active Problems:   Renal colic on left side   Discharged Condition: good  Hospital Course: The patient was admitted for intractable pain on 10/06/2014 from a left ureteral stone. On 10/07/2014 she was taken to the OR and had a left ureteral stent placed. The patient tolerated the procedure well and was transferred to the floor on regular diet, IV pain meds, IV fluids. After the procedure she felt well and was ready to be discharged home. Prior to discharge the pt was tolerating a regular diet, pain was controlled on PO pain meds, they were ambulating without difficulty, and they had normal bowel function.    Consults: None  Significant Diagnostic Studies: none  Treatments: surgery: Left ureteral stent placement  Discharge Exam: Blood pressure 110/76, pulse 57, temperature 98.8 F (37.1 C), temperature source Oral, resp. rate 20, height 5\' 3"  (1.6 m), weight 86.183 kg (190 lb), last menstrual period 09/13/2014, SpO2 96 %. General appearance: alert, cooperative and appears stated age Head: Normocephalic, without obvious abnormality, atraumatic Ears: normal TM's and external ear canals both ears Nose: Nares normal. Septum midline. Mucosa normal. No drainage or sinus tenderness. Neck: no adenopathy, no carotid bruit, no JVD, supple, symmetrical, trachea midline and thyroid not enlarged, symmetric, no tenderness/mass/nodules Chest wall: no tenderness Cardio: regular rate and rhythm, S1, S2 normal, no murmur, click, rub or gallop GI: soft, non-tender; bowel sounds normal; no masses,  no organomegaly Extremities: extremities normal, atraumatic, no cyanosis or edema Neurologic: Grossly normal  Disposition: 01-Home or Self Care     Medication List    STOP taking these  medications        DOXYCYCLINE PO     HYDROcodone-acetaminophen 5-325 MG per tablet  Commonly known as:  NORCO      TAKE these medications        cetirizine 10 MG tablet  Commonly known as:  ZYRTEC  Take 10 mg by mouth daily as needed for allergies.     ELMIRON 100 MG capsule  Generic drug:  pentosan polysulfate  TAKE 2 CAPSULES BY MOUTH TWICE DAILY     Linaclotide 145 MCG Caps capsule  Commonly known as:  LINZESS  Take 1 capsule (145 mcg total) by mouth daily. At least 30 minutes before breakfast     omeprazole 20 MG capsule  Commonly known as:  PRILOSEC  Take 20 mg by mouth daily as needed (acid reflux).     ondansetron 4 MG tablet  Commonly known as:  ZOFRAN  Take 1 tablet (4 mg total) by mouth every 6 (six) hours as needed.     oxybutynin 10 MG 24 hr tablet  Commonly known as:  DITROPAN XL  Take 1 tablet (10 mg total) by mouth at bedtime.     oxyCODONE-acetaminophen 5-325 MG per tablet  Commonly known as:  ROXICET  Take 1 tablet by mouth every 6 (six) hours as needed for severe pain.     phenazopyridine 200 MG tablet  Commonly known as:  PYRIDIUM  Take 1 tablet (200 mg total) by mouth 3 (three) times daily as needed for pain.     tamsulosin 0.4 MG Caps capsule  Commonly known as:  FLOMAX  Take 1 capsule (0.4 mg total) by mouth daily.         SignedCleon Gustin 10/09/2014, 3:51  AM

## 2014-10-09 NOTE — Interval H&P Note (Signed)
History and Physical Interval Note: Patient is s/p left ureteral stent placement on 9/13 for persistent pain.  Her pain has been better.  No fevers.   Proceed with scheduled ESWL. 10/09/2014 5:21 AM  Marissa Trevino  has presented today for surgery, with the diagnosis of left proximal ureteral stone  The various methods of treatment have been discussed with the patient and family. After consideration of risks, benefits and other options for treatment, the patient has consented to  Procedure(s): LEFT EXTRACORPOREAL SHOCK WAVE LITHOTRIPSY (ESWL) (Left) as a surgical intervention .  The patient's history has been reviewed, patient examined, no change in status, stable for surgery.  I have reviewed the patient's chart and labs.  Questions were answered to the patient's satisfaction.     [name]

## 2014-10-09 NOTE — H&P (Signed)
Active Problems Problems  1. Calculus of proximal left ureter (N20.1) 2. Left nephrolithiasis (N20.0)  History of Present Illness Marissa Trevino is a 39 yo WF who had the onset Thursday of some left flank pain. She took ibuprofen then. The pain became worse on Saturday AM and was severe with nausea and vomiting. See was seen in the ER where a CT showed a 6 x 65m left proximal stone with smaller left renal stones. She has had no prior stones. She is on her periods and can't say whether she has hematuria.  She was seen by her PCP with left flank pain and was found to have hematuria and was treated with doxycycline. She has a history of IC and Dr. EElonda Huskyhas been seeing her for that. She is on elmiron and elavil. She had gotten DMSO in the past.   Past Medical History Problems  1. History of arthritis (Z87.39) 2. History of esophageal reflux (Z87.19) 3. History of renal calculi (Z87.442) 4. History of Interstitial cystitis (N30.10)  Surgical History Problems  1. History of Cesarean Section 2. History of Cholecystectomy  Current Meds 1. Elmiron CAPS;  Therapy: (Recorded:12Sep2016) to Recorded 2. Hydrocodone-Acetaminophen TABS;  Therapy: (Recorded:12Sep2016) to Recorded 3. Zofran TABS;  Therapy: (Recorded:12Sep2016) to Recorded 4. ZyrTEC Allergy TABS;  Therapy: (Recorded:12Sep2016) to Recorded  Allergies Medication  1. Codeine Derivatives  Family History Problems  1. Family history of Deceased : Father 2. Family history of lung cancer (Z80.1) : Father  Social History Problems    Caffeine use (F15.90)   Never smoker   Non-smoker (Z78.9)   Number of children   Occupation   Single  Review of Systems Genitourinary, constitutional, skin, eye, otolaryngeal, hematologic/lymphatic, cardiovascular, pulmonary, endocrine, musculoskeletal, gastrointestinal, neurological and psychiatric system(s) were reviewed and pertinent findings if present are noted and are otherwise negative.   Genitourinary: urinary frequency, nocturia, incontinence (stress with coughing and nausea. ), urinary stream starts and stops and hematuria.  Gastrointestinal: nausea, vomiting, heartburn and constipation.  Constitutional: night sweats.  ENT: sinus problems.  Hematologic/Lymphatic: a tendency to easily bruise.  Respiratory: cough.  Endocrine: polydipsia.  Neurological: dizziness and headache.    Vitals Vital Signs [Data Includes: Last 1 Day]  Recorded: 12Sep2016 11:17AM  Height: 5 ft 3 in Weight: 193 lb  BMI Calculated: 34.19 BSA Calculated: 1.9 Blood Pressure: 125 / 82 Temperature: 98.3 F Heart Rate: 67  Physical Exam Constitutional: Well nourished and well developed . No acute distress.  ENT:. The ears and nose are normal in appearance.  Neck: The appearance of the neck is normal and no neck mass is present.  Pulmonary: No respiratory distress and normal respiratory rhythm and effort.  Cardiovascular: Heart rate and rhythm are normal . No peripheral edema.  Abdomen: The abdomen is mildly obese. The abdomen is soft and nontender. No masses are palpated. No CVA tenderness. No hernias are palpable. No hepatosplenomegaly noted.  Lymphatics: The posterior cervical and supraclavicular nodes are not enlarged or tender.  Skin: Normal skin turgor, no visible rash and no visible skin lesions.  Neuro/Psych:. Mood and affect are appropriate.    Results/Data Urine [Data Includes: Last 1 Day]   12Sep2016  COLOR YELLOW   APPEARANCE CLEAR   SPECIFIC GRAVITY 1.020   pH 8.0   GLUCOSE NEGATIVE   BILIRUBIN NEGATIVE   KETONE 1+   BLOOD 3+   PROTEIN NEGATIVE   NITRITE NEGATIVE   LEUKOCYTE ESTERASE TRACE   SQUAMOUS EPITHELIAL/HPF 0-5 HPF  WBC 0-5 WBC/HPF  RBC >60  RBC/HPF  BACTERIA FEW HPF  CRYSTALS See Below HPF  CASTS NONE SEEN LPF  Yeast NONE SEEN HPF   Old records or history reviewed: ER notes and labs reviewed. In addition to the stones she has a left breast mass on CT and will  be getting a mammogram.  The following images/tracing/specimen were independently visualized:  CT reviewed. KUB today shows the 6 x 68m left proximal stone at L4 and there are small LLP stones. No other bone, gas or soft tissue shadows are noted.  The following clinical lab reports were reviewed:  UA reviewed.    Assessment Assessed  1. Calculus of proximal left ureter (N20.1) 2. Left nephrolithiasis (N20.0)  She has a 6 x 849mleft proximal stone at L4 and small renal stones. Her pain is well controlled today.  She has a left breast mass and has been scheduled for a mammogram.   Plan Calculus of proximal left ureter  1. Follow-up Schedule Surgery Office  Follow-up  Status: Hold For - Appointment   Requested for: 12Sep2016 2. KUB; Status:Resulted - Requires Verification;   Done: 1242DLK58942:13PM Health Maintenance  3. UA With REFLEX; [Do Not Release]; Status:Resulted - Requires Verification;   Done: :  83AFH83070:49AM  I discussed the treatment options including MET, ESWL and Ureteroscopy and have recommended ESWL. I reviewed the risks of bleeding, infection, injury to adjacent organs, failure of fragmentation, obstructing fragments, need for secondary procedures, thrombotic events and sedation risks.

## 2014-10-09 NOTE — Discharge Instructions (Signed)
See [facility] discharge instructions in chart.  

## 2014-10-09 NOTE — Op Note (Signed)
See [facility] OP note scanned into chart. Also because of the size, density, location and other factors that cannot be anticipated I feel this will likely be a staged procedure. This fact supersedes any indication in the scanned [facility] operative note to the contrary.  

## 2014-10-14 ENCOUNTER — Other Ambulatory Visit: Payer: Self-pay | Admitting: Adult Health

## 2014-10-14 ENCOUNTER — Ambulatory Visit (HOSPITAL_COMMUNITY)
Admission: RE | Admit: 2014-10-14 | Discharge: 2014-10-14 | Disposition: A | Discharge status: Home / Self Care | Payer: 59 | Source: Ambulatory Visit | Attending: Adult Health | Admitting: Adult Health

## 2014-10-14 DIAGNOSIS — IMO0002 Reserved for concepts with insufficient information to code with codable children: Secondary | ICD-10-CM

## 2014-10-14 DIAGNOSIS — R229 Localized swelling, mass and lump, unspecified: Principal | ICD-10-CM

## 2014-10-14 DIAGNOSIS — N63 Unspecified lump in breast: Secondary | ICD-10-CM | POA: Diagnosis present

## 2014-10-22 ENCOUNTER — Ambulatory Visit (INDEPENDENT_AMBULATORY_CARE_PROVIDER_SITE_OTHER): Payer: 59 | Admitting: Obstetrics & Gynecology

## 2014-10-22 VITALS — BP 110/60 | HR 72 | Wt 194.0 lb

## 2014-10-22 DIAGNOSIS — R896 Abnormal cytological findings in specimens from other organs, systems and tissues: Secondary | ICD-10-CM

## 2014-10-22 DIAGNOSIS — IMO0002 Reserved for concepts with insufficient information to code with codable children: Secondary | ICD-10-CM

## 2014-10-22 DIAGNOSIS — R87610 Atypical squamous cells of undetermined significance on cytologic smear of cervix (ASC-US): Secondary | ICD-10-CM

## 2014-10-22 NOTE — Progress Notes (Signed)
Patient ID: Marissa Trevino, female   DOB: 09-Feb-1975, 39 y.o.   MRN: [id number] Colposcopy Procedure Note:  Colposcopy Procedure Note  Indications: Pap smear 2 weeks ago showed: ASCUS with POSITIVE high risk HPV. The prior pap showed no abnormalities.  Prior cervical/vaginal disease: . Prior cervical treatment: no treatment.  Smoker:  No. New sexual partner:  Yes.    : time frame:  4 months  History of abnormal Pap: yes  Procedure Details  The risks and benefits of the procedure and Written informed consent obtained.  Speculum placed in vagina and excellent visualization of cervix achieved, cervix swabbed x 3 with acetic acid solution.  Findings: Cervix: no visible lesions, no mosaicism, no punctation and no abnormal vasculature; no biopsies taken. Vaginal inspection: vaginal colposcopy not performed. Vulvar colposcopy: vulvar colposcopy not performed.  Specimens: none  Complications: none.  Plan: Follow up Pap smear 1 year

## 2014-10-28 ENCOUNTER — Ambulatory Visit (HOSPITAL_COMMUNITY): Admission: RE | Admit: 2014-10-28 | Payer: 59 | Source: Ambulatory Visit

## 2014-10-28 ENCOUNTER — Ambulatory Visit (HOSPITAL_COMMUNITY): Payer: 59

## 2014-10-28 ENCOUNTER — Other Ambulatory Visit: Payer: Self-pay | Admitting: Adult Health

## 2014-10-28 ENCOUNTER — Ambulatory Visit (HOSPITAL_COMMUNITY)
Admission: RE | Admit: 2014-10-28 | Discharge: 2014-10-28 | Disposition: A | Discharge status: Home / Self Care | Payer: 59 | Source: Ambulatory Visit | Attending: Adult Health | Admitting: Adult Health

## 2014-10-28 DIAGNOSIS — R229 Localized swelling, mass and lump, unspecified: Secondary | ICD-10-CM

## 2014-10-28 DIAGNOSIS — N6320 Unspecified lump in the left breast, unspecified quadrant: Secondary | ICD-10-CM

## 2014-10-28 DIAGNOSIS — N63 Unspecified lump in breast: Secondary | ICD-10-CM | POA: Diagnosis present

## 2014-10-28 DIAGNOSIS — IMO0002 Reserved for concepts with insufficient information to code with codable children: Secondary | ICD-10-CM

## 2014-10-28 DIAGNOSIS — D242 Benign neoplasm of left breast: Secondary | ICD-10-CM | POA: Diagnosis not present

## 2014-10-28 MED ORDER — LIDOCAINE-EPINEPHRINE (PF) 1 %-1:200000 IJ SOLN
INTRAMUSCULAR | Status: DC
Start: 2014-10-28 — End: 2014-10-29
  Filled 2014-10-28: qty 10

## 2014-10-28 NOTE — Discharge Instructions (Addendum)
Breast Biopsy A breast biopsy is a test during which a sample of tissue is taken from your breast. The breast tissue is looked at under a microscope for cancer cells.  BEFORE THE PROCEDURE  Make plans to have someone drive you home after the test.  Do not smoke for 2 weeks before the test. Stop smoking, if you smoke.  Do not drink alcohol for 24 hours before the test.  Wear a good support bra to the test. PROCEDURE  You may be given one of the following:  A medicine to numb the breast area (local anesthetic).  A medicine to make you fall asleep (general anesthetic). There are different types of breast biopsies. They include:  Fine-needle aspiration.  A needle is put into the breast lump.  The needle takes out fluid and cells from the lump.  Ultrasound imaging may be used to help find the lump and to put the needle in the right spot.  Core-needle biopsy.  A needle is put into the breast lump.  The needle is put in your breast 3-6 times.  The needle removes breast tissue.  An ultrasound image or X-ray is often used to find the right spot to put in the needle.  Stereotactic biopsy.  X-rays and a computer are used to study X-ray pictures of the breast lump.  The computer finds where the needle needs to be put into the breast.  Tissue samples are taken out.  Vacuum-assisted biopsy.  A small cut (incision) is made in your breast.  A biopsy device is put through the cut and into the breast tissue.  The biopsy device draws abnormal breast tissue into the biopsy device.  A large tissue sample is often removed.  No stitches are needed.  Ultrasound-guided core-needle biopsy.  Ultrasound imaging helps guide the needle into the area of the breast that is not normal.  A cut is made in the breast. The needle is put into the breast lump.  Tissue samples are taken out.  Open biopsy.  A large cut is made in the breast.  Your doctor will try to remove the whole  breast lump or as much as possible. All tissue, fluid, or cell samples are looked at under a microscope.  AFTER THE PROCEDURE  You will be taken to an area to recover. You will be able to go home once you are doing well and are without problems.  You may have bruising on your breast. This is normal.  A pressure bandage (dressing) may be put on your breast for 24-48 hours. This type of bandage is wrapped tightly around your chest. It helps stop fluid from building up underneath tissues. Document Released: 04/04/2011 Document Revised: 05/27/2013 Document Reviewed: 04/04/2011 [facility] Patient Information 2015 [place], [place]. This information is not intended to replace advice given to you by your health care provider. Make sure you discuss any questions you have with your health care provider. Breast Biopsy Care After These instructions give you information on caring for yourself after your procedure. Your doctor may also give you more specific instructions. Call your doctor if you have any problems or questions after your procedure. HOME CARE  Only take medicine as told by your doctor.  Do not take aspirin.  Keep your sutures (stitches) dry when bathing.  Protect the biopsy area. Do not let the area get bumped.  Avoid activities that could pull the biopsy site open until your doctor approves. This includes:  Stretching.  Reaching.  Exercise.  Sports.  Lifting more than 3lb.  Continue your normal diet.  Wear a good support bra for as long as told by your doctor.  Change any bandages (dressings) as told by your doctor.  Do not drink alcohol while taking pain medicine.  Keep all doctor visits as told. Ask when your test results will be ready. Make sure you get your test results. GET HELP RIGHT AWAY IF:   You have a fever.  You have more bleeding (more than a small spot) from the biopsy site.  You have trouble breathing.  You have yellowish-white fluid (pus) coming from  the biopsy site.  You have redness, puffiness (swelling), or more pain in the biopsy site.  You have a bad smell coming from the biopsy site.  Your biopsy site opens after sutures, staples, or sticky strips have been removed.  You have a rash.  You need stronger medicine. MAKE SURE YOU:  Understand these instructions.  Will watch your condition.  Will get help right away if you are not doing well or get worse. Document Released: 11/06/2008 Document Revised: 04/04/2011 Document Reviewed: 02/20/2011 [facility] Patient Information 2015 [place], [place]. This information is not intended to replace advice given to you by your health care provider. Make sure you discuss any questions you have with your health care provider.

## 2014-11-03 ENCOUNTER — Encounter: Payer: Self-pay | Admitting: Adult Health

## 2014-11-03 DIAGNOSIS — D242 Benign neoplasm of left breast: Secondary | ICD-10-CM | POA: Insufficient documentation

## 2014-11-03 HISTORY — DX: Benign neoplasm of left breast: D24.2

## 2014-11-13 ENCOUNTER — Telehealth: Payer: Self-pay | Admitting: Adult Health

## 2014-11-13 NOTE — Telephone Encounter (Signed)
Spoke with pt. Pt is having urinary frequency and says her bladder feels like it's throbbing. I advised she would need to be seen. Call transferred to front desk for appt. [place]

## 2014-11-14 ENCOUNTER — Ambulatory Visit (INDEPENDENT_AMBULATORY_CARE_PROVIDER_SITE_OTHER): Payer: 59 | Admitting: Adult Health

## 2014-11-14 ENCOUNTER — Encounter: Payer: Self-pay | Admitting: Adult Health

## 2014-11-14 VITALS — BP 140/84 | HR 68 | Ht 63.0 in | Wt 191.0 lb

## 2014-11-14 DIAGNOSIS — Z87442 Personal history of urinary calculi: Secondary | ICD-10-CM

## 2014-11-14 DIAGNOSIS — R319 Hematuria, unspecified: Secondary | ICD-10-CM

## 2014-11-14 DIAGNOSIS — N3010 Interstitial cystitis (chronic) without hematuria: Secondary | ICD-10-CM | POA: Diagnosis not present

## 2014-11-14 DIAGNOSIS — R350 Frequency of micturition: Secondary | ICD-10-CM | POA: Insufficient documentation

## 2014-11-14 DIAGNOSIS — N390 Urinary tract infection, site not specified: Secondary | ICD-10-CM | POA: Diagnosis not present

## 2014-11-14 HISTORY — DX: Frequency of micturition: R35.0

## 2014-11-14 HISTORY — DX: Personal history of urinary calculi: Z87.442

## 2014-11-14 LAB — POCT URINALYSIS DIPSTICK
Glucose, UA: NEGATIVE
LEUKOCYTES UA: NEGATIVE
NITRITE UA: POSITIVE
PROTEIN UA: NEGATIVE

## 2014-11-14 MED ORDER — SULFAMETHOXAZOLE-TRIMETHOPRIM 800-160 MG PO TABS: 1.0000 | ORAL_TABLET | Freq: Two times a day (BID) | ORAL | Status: DC

## 2014-11-14 MED ORDER — PHENAZOPYRIDINE HCL 200 MG PO TABS: 200.0000 mg | ORAL_TABLET | Freq: Three times a day (TID) | ORAL | Status: DC | PRN

## 2014-11-14 NOTE — Patient Instructions (Signed)
Urinary Tract Infection Urinary tract infections (UTIs) can develop anywhere along your urinary tract. Your urinary tract is your body's drainage system for removing wastes and extra water. Your urinary tract includes two kidneys, two ureters, a bladder, and a urethra. Your kidneys are a pair of bean-shaped organs. Each kidney is about the size of your fist. They are located below your ribs, one on each side of your spine. CAUSES Infections are caused by microbes, which are microscopic organisms, including fungi, viruses, and bacteria. These organisms are so small that they can only be seen through a microscope. Bacteria are the microbes that most commonly cause UTIs. SYMPTOMS  Symptoms of UTIs may vary by age and gender of the patient and by the location of the infection. Symptoms in young women typically include a frequent and intense urge to urinate and a painful, burning feeling in the bladder or urethra during urination. Older women and men are more likely to be tired, shaky, and weak and have muscle aches and abdominal pain. A fever may mean the infection is in your kidneys. Other symptoms of a kidney infection include pain in your back or sides below the ribs, nausea, and vomiting. DIAGNOSIS To diagnose a UTI, your caregiver will ask you about your symptoms. Your caregiver will also ask you to provide a urine sample. The urine sample will be tested for bacteria and white blood cells. White blood cells are made by your body to help fight infection. TREATMENT  Typically, UTIs can be treated with medication. Because most UTIs are caused by a bacterial infection, they usually can be treated with the use of antibiotics. The choice of antibiotic and length of treatment depend on your symptoms and the type of bacteria causing your infection. HOME CARE INSTRUCTIONS  If you were prescribed antibiotics, take them exactly as your caregiver instructs you. Finish the medication even if you feel better after  you have only taken some of the medication.  Drink enough water and fluids to keep your urine clear or pale yellow.  Avoid caffeine, tea, and carbonated beverages. They tend to irritate your bladder.  Empty your bladder often. Avoid holding urine for long periods of time.  Empty your bladder before and after sexual intercourse.  After a bowel movement, women should cleanse from front to back. Use each tissue only once. SEEK MEDICAL CARE IF:   You have back pain.  You develop a fever.  Your symptoms do not begin to resolve within 3 days. SEEK IMMEDIATE MEDICAL CARE IF:   You have severe back pain or lower abdominal pain.  You develop chills.  You have nausea or vomiting.  You have continued burning or discomfort with urination. MAKE SURE YOU:   Understand these instructions.  Will watch your condition.  Will get help right away if you are not doing well or get worse.   This information is not intended to replace advice given to you by your health care provider. Make sure you discuss any questions you have with your health care provider.   Document Released: 10/20/2004 Document Revised: 10/01/2014 Document Reviewed: 02/18/2011 Elsevier Interactive Patient Education 2016 [employer] fluids Take septra ds and pyridium

## 2014-11-14 NOTE — Progress Notes (Signed)
Subjective:     Patient ID: Marissa Trevino, female   DOB: 1975-08-31, 39 y.o.   MRN: [id number]  [place] [facility]. Marissa Trevino is a 39 year old white female,in complaining of urinary frequency and feels like bees in her bladder she says.She has history of kidney stones and chronic IC, is taking elimiron.  Review of Systems Patient denies any headaches, hearing loss, fatigue, blurred vision, shortness of breath, chest pain, abdominal pain, problems with bowel movements, or intercourse. No joint pain or mood swings. See HPI for positives. Reviewed past medical,surgical, social and family history. Reviewed medications and allergies.     Objective:   Physical Exam BP 140/84 mmHg  Pulse 68  Ht 5\' 3"  (1.6 m)  Wt 191 lb (86.637 kg)  BMI 33.84 kg/m2  LMP 11/01/2014 urine 3+ blood and + nitrates, Skin warm and dry.Pelvic: external genitalia is normal in appearance no lesions, vagina: scant discharge without odor,urethra has no lesions or masses noted, cervix:smooth and bulbous, uterus: normal size, shape and contour, non tender, no masses felt, adnexa: no masses or tenderness noted. Bladder is tender and no masses felt.Some CVAT on left, had kidney stones on left and had lithotripsy. Encouraged to make appt to see Dr Elonda Husky for bladder irrigation in near future.     Assessment:     UTI Urinary frequency Chronic IC History of kidney stones Hematuria     Plan:    Push fluids Rx septra ds 1 bid x 7 days #14 no refills Rx pyridium 200 mg !0 1 tid prn with 1 refill UA C&S sent Follow up prn

## 2014-11-15 LAB — URINALYSIS, ROUTINE W REFLEX MICROSCOPIC
BILIRUBIN UA: NEGATIVE
Glucose, UA: NEGATIVE
KETONES UA: NEGATIVE
Leukocytes, UA: NEGATIVE
NITRITE UA: NEGATIVE
Protein, UA: NEGATIVE
SPEC GRAV UA: 1.016 (ref 1.005–1.030)
Urobilinogen, Ur: 1 mg/dL (ref 0.2–1.0)
pH, UA: 6 (ref 5.0–7.5)

## 2014-11-15 LAB — URINE CULTURE

## 2014-11-15 LAB — MICROSCOPIC EXAMINATION
Bacteria, UA: NONE SEEN
Casts: NONE SEEN /lpf

## 2014-11-25 ENCOUNTER — Telehealth: Payer: Self-pay | Admitting: Adult Health

## 2014-11-25 NOTE — Telephone Encounter (Signed)
Spoke with pt. Pt is still having burning with urination. She is drinking plenty of fluids and cranberry juice. Pt is wanting a refill on antibiotic. Please advise. Thanks!! [place]

## 2014-11-25 NOTE — Telephone Encounter (Signed)
Left message x 1. JSY 

## 2014-11-25 NOTE — Telephone Encounter (Signed)
Spoke with pt letting her know she will need to schedule an appt with Dr. Elonda Husky for IC, culture didn't grow anything. Pt voiced understanding and will call back tomorrow to schedule an appt. [place]

## 2014-12-04 ENCOUNTER — Telehealth: Payer: Self-pay | Admitting: *Deleted

## 2014-12-04 MED ORDER — NYSTATIN-TRIAMCINOLONE 100000-0.1 UNIT/GM-% EX CREA: 1.0000 "application " | TOPICAL_CREAM | Freq: Two times a day (BID) | CUTANEOUS | Status: DC

## 2014-12-04 NOTE — Telephone Encounter (Signed)
Spoke with pt. Pt is having itching around vaginal lips. She took a Diflucan recently, but still has some itching. She said you have ordered a cream before for her. Please advise. Thanks!1 JSY

## 2014-12-04 NOTE — Telephone Encounter (Signed)
Will rx mytrex ?

## 2014-12-10 ENCOUNTER — Ambulatory Visit (INDEPENDENT_AMBULATORY_CARE_PROVIDER_SITE_OTHER): Payer: 59 | Admitting: Adult Health

## 2014-12-10 ENCOUNTER — Encounter: Payer: Self-pay | Admitting: Adult Health

## 2014-12-10 VITALS — BP 140/92 | HR 72 | Ht 63.0 in | Wt 194.0 lb

## 2014-12-10 DIAGNOSIS — A599 Trichomoniasis, unspecified: Secondary | ICD-10-CM | POA: Diagnosis not present

## 2014-12-10 DIAGNOSIS — N898 Other specified noninflammatory disorders of vagina: Secondary | ICD-10-CM

## 2014-12-10 HISTORY — DX: Trichomoniasis, unspecified: A59.9

## 2014-12-10 HISTORY — DX: Other specified noninflammatory disorders of vagina: N89.8

## 2014-12-10 LAB — POCT WET PREP (WET MOUNT)
Clue Cells Wet Prep Whiff POC: POSITIVE
TRICHOMONAS WET PREP HPF POC: POSITIVE
WBC, Wet Prep HPF POC: POSITIVE

## 2014-12-10 MED ORDER — METRONIDAZOLE 500 MG PO TABS: ORAL_TABLET | ORAL | Status: DC

## 2014-12-10 NOTE — Progress Notes (Signed)
Subjective:     Patient ID: Marissa Trevino, female   DOB: 24-Oct-1975, 39 y.o.   MRN: [id number]  HPI Marissa Trevino is a 39 year old white female in complaining of vaginal irritation and some discomfort lower stomach.   Review of Systems Patient denies any headaches, hearing loss, fatigue, blurred vision, shortness of breath, chest pain,  problems with bowel movements, urination, or intercourse. No joint pain or mood swings.See HPI for positives. Reviewed past medical,surgical, social and family history. Reviewed medications and allergies.     Objective:   Physical Exam BP 140/92 mmHg  Pulse 72  Ht 5\' 3"  (1.6 m)  Wt 194 lb (87.998 kg)  BMI 34.37 kg/m2  LMP 11/25/2014 Skin warm and dry.Pelvic: external genitalia is normal in appearance no lesions,red in creases, vagina: tan,frothy discharge with odor,urethra has no lesions or masses noted, cervix:smooth and bulbous, uterus: normal size, shape and contour, non tender, no masses felt, adnexa: no masses or tenderness noted. Bladder is non tender and no masses felt. Wet prep: + for trich and +WBCs.   Face time 15 minutes with 50 % counseling on trich.   Assessment:     Vaginal irritation Vaginal discharge Trichomonas     Plan:    GC/CHL sent on urine Rx flagyl 500 mg #4 take 4 po now, and treated partner Marissa Trevino with same,no alcohol,no sex til after POT Return 11/28 for POT Review handouts on trich

## 2014-12-10 NOTE — Patient Instructions (Signed)
Trichomoniasis Trichomoniasis is an infection caused by an organism called Trichomonas. The infection can affect both women and men. In women, the outer female genitalia and the vagina are affected. In men, the penis is mainly affected, but the prostate and other reproductive organs can also be involved. Trichomoniasis is a sexually transmitted infection (STI) and is most often passed to another person through sexual contact.  RISK FACTORS  Having unprotected sexual intercourse.  Having sexual intercourse with an infected partner. SIGNS AND SYMPTOMS  Symptoms of trichomoniasis in women include:  Abnormal gray-green frothy vaginal discharge.  Itching and irritation of the vagina.  Itching and irritation of the area outside the vagina. Symptoms of trichomoniasis in men include:   Penile discharge with or without pain.  Pain during urination. This results from inflammation of the urethra. DIAGNOSIS  Trichomoniasis may be found during a Pap test or physical exam. Your health care provider may use one of the following methods to help diagnose this infection:  Testing the pH of the vagina with a test tape.  Using a vaginal swab test that checks for the Trichomonas organism. A test is available that provides results within a few minutes.  Examining a urine sample.  Testing vaginal secretions. Your health care provider may test you for other STIs, including HIV. TREATMENT   You may be given medicine to fight the infection. Women should inform their health care provider if they could be or are pregnant. Some medicines used to treat the infection should not be taken during pregnancy.  Your health care provider may recommend over-the-counter medicines or creams to decrease itching or irritation.  Your sexual partner will need to be treated if infected.  Your health care provider may test you for infection again 3 months after treatment. HOME CARE INSTRUCTIONS   Take medicines only as  directed by your health care provider.  Take over-the-counter medicine for itching or irritation as directed by your health care provider.  Do not have sexual intercourse while you have the infection.  Women should not douche or wear tampons while they have the infection.  Discuss your infection with your partner. Your partner may have gotten the infection from you, or you may have gotten it from your partner.  Have your sex partner get examined and treated if necessary.  Practice safe, informed, and protected sex.  See your health care provider for other STI testing. SEEK MEDICAL CARE IF:   You still have symptoms after you finish your medicine.  You develop abdominal pain.  You have pain when you urinate.  You have bleeding after sexual intercourse.  You develop a rash.  Your medicine makes you sick or makes you throw up (vomit). MAKE SURE YOU:  Understand these instructions.  Will watch your condition.  Will get help right away if you are not doing well or get worse.   This information is not intended to replace advice given to you by your health care provider. Make sure you discuss any questions you have with your health care provider.   Document Released: 07/06/2000 Document Revised: 01/31/2014 Document Reviewed: 10/22/2012 Elsevier Interactive Patient Education 2016 [employer]. Follow up 11/28 Take flagyl no alcohol no sex til after POT

## 2014-12-11 LAB — GC/CHLAMYDIA PROBE AMP
CHLAMYDIA, DNA PROBE: NEGATIVE
NEISSERIA GONORRHOEAE BY PCR: NEGATIVE

## 2014-12-22 ENCOUNTER — Ambulatory Visit: Payer: 59 | Admitting: Adult Health

## 2015-01-01 ENCOUNTER — Ambulatory Visit (INDEPENDENT_AMBULATORY_CARE_PROVIDER_SITE_OTHER): Payer: 59 | Admitting: Adult Health

## 2015-01-01 ENCOUNTER — Encounter: Payer: Self-pay | Admitting: Adult Health

## 2015-01-01 VITALS — BP 130/90 | HR 74 | Ht 63.0 in | Wt 195.5 lb

## 2015-01-01 DIAGNOSIS — N898 Other specified noninflammatory disorders of vagina: Secondary | ICD-10-CM

## 2015-01-01 LAB — POCT WET PREP (WET MOUNT)

## 2015-01-01 NOTE — Progress Notes (Signed)
Subjective:     Patient ID: Marissa Trevino, female   DOB: 08/31/1975, 39 y.o.   MRN: [id number]  HPI [name] is a 39 year old white female in for proof of treatment for recent trich. No complaints.  Review of Systems Patient denies any headaches, hearing loss, fatigue, blurred vision, shortness of breath, chest pain, abdominal pain, problems with bowel movements, urination, or intercourse. No joint pain or mood swings. Reviewed past medical,surgical, social and family history. Reviewed medications and allergies.     Objective:   Physical Exam BP 130/90 mmHg  Pulse 74  Ht 5\' 3"  (1.6 m)  Wt 195 lb 8 oz (88.678 kg)  BMI 34.64 kg/m2  LMP 12/20/2014 Skin warm and dry.Pelvic: external genitalia is normal in appearance no lesions, vagina: white discharge without odor,urethra has no lesions or masses noted, cervix:smooth and bulbous, uterus: normal size, shape and contour, non tender, no masses felt, adnexa: no masses or tenderness noted. Bladder is non tender and no masses felt. Wet prep:  + few WBCs.     Assessment:     Vaginal discharge, trich resolved    Plan:     Follow up prn

## 2015-01-01 NOTE — Patient Instructions (Signed)
Follow up prn

## 2015-02-12 DIAGNOSIS — X32XXXD Exposure to sunlight, subsequent encounter: Secondary | ICD-10-CM | POA: Diagnosis not present

## 2015-02-12 DIAGNOSIS — Z85828 Personal history of other malignant neoplasm of skin: Secondary | ICD-10-CM | POA: Diagnosis not present

## 2015-02-12 DIAGNOSIS — Z08 Encounter for follow-up examination after completed treatment for malignant neoplasm: Secondary | ICD-10-CM | POA: Diagnosis not present

## 2015-02-12 DIAGNOSIS — L570 Actinic keratosis: Secondary | ICD-10-CM | POA: Diagnosis not present

## 2015-02-23 ENCOUNTER — Ambulatory Visit (INDEPENDENT_AMBULATORY_CARE_PROVIDER_SITE_OTHER): Payer: 59 | Admitting: Adult Health

## 2015-02-23 ENCOUNTER — Encounter: Payer: Self-pay | Admitting: Adult Health

## 2015-02-23 VITALS — BP 124/86 | HR 70 | Ht 63.0 in | Wt 190.5 lb

## 2015-02-23 DIAGNOSIS — Z1389 Encounter for screening for other disorder: Secondary | ICD-10-CM | POA: Diagnosis not present

## 2015-02-23 DIAGNOSIS — N3010 Interstitial cystitis (chronic) without hematuria: Secondary | ICD-10-CM

## 2015-02-23 DIAGNOSIS — N898 Other specified noninflammatory disorders of vagina: Secondary | ICD-10-CM | POA: Diagnosis not present

## 2015-02-23 HISTORY — DX: Interstitial cystitis (chronic) without hematuria: N30.10

## 2015-02-23 LAB — POCT URINALYSIS DIPSTICK
Blood, UA: NEGATIVE
GLUCOSE UA: NEGATIVE
LEUKOCYTES UA: NEGATIVE
Nitrite, UA: NEGATIVE
PROTEIN UA: NEGATIVE

## 2015-02-23 LAB — POCT WET PREP (WET MOUNT)
Clue Cells Wet Prep Whiff POC: NEGATIVE
WBC WET PREP: NEGATIVE

## 2015-02-23 MED ORDER — NYSTATIN-TRIAMCINOLONE 100000-0.1 UNIT/GM-% EX CREA: 1.0000 "application " | TOPICAL_CREAM | Freq: Two times a day (BID) | CUTANEOUS | Status: DC

## 2015-02-23 NOTE — Patient Instructions (Signed)
Use mytrex 2-3 x daily prn Follow up prn

## 2015-02-23 NOTE — Progress Notes (Signed)
Subjective:     Patient ID: Marissa Trevino, female   DOB: [birth year], 40 y.o.   MRN: [id number]  HPI Marissa Trevino is a 40 year old white female in complaining of vaginal irritation, just on outside.No discharge or odor.Has been drinking vinegar, and does shave.  Review of Systems Patient denies any headaches, hearing loss, fatigue, blurred vision, shortness of breath, chest pain, abdominal pain, problems with bowel movements, urination, or intercourse. No joint pain or mood swings. See HPI for positives Reviewed past medical,surgical, social and family history. Reviewed medications and allergies.     Objective:   Physical Exam BP 124/86 mmHg  Pulse 70  Ht 5\' 3"  (1.6 m)  Wt 190 lb 8 oz (86.41 kg)  BMI 33.75 kg/m2  LMP 02/09/2015 (Approximate) urine negative, Skin warm and dry.Pelvic: external genitalia is normal in appearance no lesions, vagina: white discharge without odor,urethra has no lesions or masses noted, cervix:smooth and bulbous, uterus: normal size, shape and contour, mildly tender, no masses felt, adnexa: no masses or tenderness noted. Bladder is non tender and no masses felt. Wet prep:negative.Increase water, may need bladder irrigation, stop vinegar, will try mytrex.   Face time 15 minutes with 50 % counseling. Assessment:     Vaginal irritation Hx IC    Plan:     Rx mytrex cream use 2-3 x daily prn with 1 refill Follow up prn

## 2015-03-26 ENCOUNTER — Other Ambulatory Visit: Payer: Self-pay | Admitting: Obstetrics & Gynecology

## 2015-03-26 MED FILL — ELMIRON 100 MG CAPSULE: 100 | 30 days supply | Qty: 120 | Fill #0

## 2015-04-20 ENCOUNTER — Encounter: Payer: Self-pay | Admitting: Obstetrics & Gynecology

## 2015-04-20 ENCOUNTER — Ambulatory Visit (INDEPENDENT_AMBULATORY_CARE_PROVIDER_SITE_OTHER): Payer: 59 | Admitting: Obstetrics & Gynecology

## 2015-04-20 VITALS — BP 134/90 | HR 74 | Ht 63.5 in | Wt 190.0 lb

## 2015-04-20 DIAGNOSIS — N3941 Urge incontinence: Secondary | ICD-10-CM

## 2015-04-20 DIAGNOSIS — N3010 Interstitial cystitis (chronic) without hematuria: Secondary | ICD-10-CM | POA: Diagnosis not present

## 2015-04-20 DIAGNOSIS — N390 Urinary tract infection, site not specified: Secondary | ICD-10-CM | POA: Diagnosis not present

## 2015-04-20 DIAGNOSIS — R309 Painful micturition, unspecified: Secondary | ICD-10-CM | POA: Diagnosis not present

## 2015-04-20 LAB — POCT URINALYSIS DIPSTICK
Glucose, UA: NEGATIVE
Ketones, UA: NEGATIVE
Nitrite, UA: NEGATIVE
PROTEIN UA: NEGATIVE

## 2015-04-20 MED ORDER — CIPROFLOXACIN HCL 500 MG PO TABS: 500.0000 mg | ORAL_TABLET | Freq: Two times a day (BID) | ORAL | Status: DC

## 2015-04-20 MED ORDER — MIRABEGRON ER 50 MG PO TB24: 50.0000 mg | ORAL_TABLET | Freq: Every day | ORAL | Status: DC

## 2015-04-20 MED FILL — CIPROFLOXACIN HCL 500 MG TA: 500 | 7 days supply | Qty: 14 | Fill #0

## 2015-04-20 MED FILL — MYRBETRIQ ER 50 MG TABLET: 50 | 30 days supply | Qty: 30 | Fill #0

## 2015-04-20 NOTE — Progress Notes (Signed)
Patient ID: Marissa Trevino, female   DOB: 08/25/1975, 40 y.o.   MRN: [id number]      Chief Complaint  Patient presents with  . having pressure with urination    Blood pressure 134/90, pulse 74, height 5' 3.5" (1.613 m), weight 190 lb (86.183 kg), last menstrual period 04/01/2015.  40 y.o. G3P3 Patient's last menstrual period was 04/01/2015. The current method of family planning is tubal ligation.  Subjective Pt with history of IC has not been getting irrigtions wants to retart Has history of UTI in past Also stopped myrbetriq 50 and wants to restart  Objective   Pertinent ROS   Labs or studies     Impression Diagnoses this Encounter::   ICD-9-CM ICD-10-CM   1. IC (interstitial cystitis) 595.1 N30.10   2. Pain with urination 788.1 R30.9 POCT Urinalysis Dipstick  3. UTI (lower urinary tract infection) 599.0 N39.0   4. Urge incontinence 788.31 N39.41     Established relevant diagnosis(es):   Plan/Recommendations: Meds ordered this encounter  Medications  . Phenylephrine-Acetaminophen (TYLENOL SINUS CONGESTION/PAIN PO)    Sig: Take by mouth as needed.  . Phenazopyridine HCl (AZO TABS PO)    Sig: Take by mouth as needed.  . ciprofloxacin (CIPRO) 500 MG tablet    Sig: Take 1 tablet (500 mg total) by mouth 2 (two) times daily.    Dispense:  14 tablet    Refill:  0  . mirabegron ER (MYRBETRIQ) 50 MG TB24 tablet    Sig: Take 1 tablet (50 mg total) by mouth daily.    Dispense:  30 tablet    Refill:  11    Labs or Scans Ordered: Orders Placed This Encounter  Procedures  . POCT Urinalysis Dipstick    Management::   Follow up Return in about 2 weeks (around 05/04/2015) for dmso irrigation, with Dr Elonda Husky.    All questions were answered.

## 2015-04-20 NOTE — Addendum Note (Signed)
Addended by: Linton Rump on: 04/20/2015 04:04 PM   Modules accepted: Orders

## 2015-04-21 LAB — URINE CULTURE

## 2015-04-22 DIAGNOSIS — J069 Acute upper respiratory infection, unspecified: Secondary | ICD-10-CM | POA: Diagnosis not present

## 2015-04-22 DIAGNOSIS — R070 Pain in throat: Secondary | ICD-10-CM | POA: Diagnosis not present

## 2015-04-22 DIAGNOSIS — Z6834 Body mass index (BMI) 34.0-34.9, adult: Secondary | ICD-10-CM | POA: Diagnosis not present

## 2015-04-22 DIAGNOSIS — J029 Acute pharyngitis, unspecified: Secondary | ICD-10-CM | POA: Diagnosis not present

## 2015-04-22 DIAGNOSIS — Z1389 Encounter for screening for other disorder: Secondary | ICD-10-CM | POA: Diagnosis not present

## 2015-04-22 DIAGNOSIS — J343 Hypertrophy of nasal turbinates: Secondary | ICD-10-CM | POA: Diagnosis not present

## 2015-04-29 ENCOUNTER — Telehealth: Payer: Self-pay | Admitting: Obstetrics & Gynecology

## 2015-04-29 NOTE — Telephone Encounter (Signed)
Her urine culture was negative, her pain is from the IC.  Keep her irrigation appointment, IC gets worse during allergy season

## 2015-04-29 NOTE — Telephone Encounter (Signed)
Pt states saw Dr.Eure last Monday was given ABX. Pt c/o sharp constant pain in bladder,abx did not touch it. Pt states she has an appt for next week (05/07/2015) for bladder  irrigation. Does pt need bladder irrigation before next week?

## 2015-04-29 NOTE — Telephone Encounter (Signed)
Pt informed urine culture negative, Keep her appt next week for bladder irrigation, IC worse allergy season. Pt verbalized understanding.

## 2015-05-05 ENCOUNTER — Telehealth: Payer: Self-pay | Admitting: *Deleted

## 2015-05-05 MED ORDER — URIBEL 118 MG PO CAPS: ORAL_CAPSULE | ORAL | Status: DC

## 2015-05-05 MED FILL — URO-MP CAPSULE: 118 | 10 days supply | Qty: 30 | Fill #0

## 2015-05-05 NOTE — Telephone Encounter (Signed)
Pt aware uribel has been sent to [facility]

## 2015-05-06 MED FILL — ELMIRON 100 MG CAPSULE: 100 | 30 days supply | Qty: 120 | Fill #1

## 2015-05-07 ENCOUNTER — Ambulatory Visit (INDEPENDENT_AMBULATORY_CARE_PROVIDER_SITE_OTHER): Payer: 59 | Admitting: Obstetrics & Gynecology

## 2015-05-07 ENCOUNTER — Encounter: Payer: Self-pay | Admitting: Obstetrics & Gynecology

## 2015-05-07 VITALS — BP 118/74 | HR 68 | Ht 63.0 in | Wt 185.0 lb

## 2015-05-07 DIAGNOSIS — N3010 Interstitial cystitis (chronic) without hematuria: Secondary | ICD-10-CM | POA: Diagnosis not present

## 2015-05-07 NOTE — Progress Notes (Signed)
Patient ID: Marissa Trevino, female   DOB: 04-03-1975, 40 y.o.   MRN: [id number] Diagnosed with IC: 2 years ago  Current Meds:  Elmiron, DMSO Dietary restrictions    Pt states her symptoms have been stable, no exacerbations, although not perfect Wants to continue on this cycle  Blood pressure 118/74, pulse 68, height 5\' 3"  (1.6 m), weight 185 lb (83.915 kg), last menstrual period 04/01/2015.    The external urethra meatus was prepped with betadine DMSO 50 cc was instilled in the usual fashion after the bladder was catheterized and emptied completely 50cc was instilled into the bladder without difficulty and the patient tolerated well She will refrain from voiding as long as possible  Follow up in 2 weeks, or as patient requests based on her symptom complex

## 2015-05-21 ENCOUNTER — Ambulatory Visit: Payer: 59 | Admitting: Obstetrics & Gynecology

## 2015-05-21 ENCOUNTER — Ambulatory Visit (INDEPENDENT_AMBULATORY_CARE_PROVIDER_SITE_OTHER): Payer: 59 | Admitting: Obstetrics & Gynecology

## 2015-05-21 ENCOUNTER — Encounter: Payer: Self-pay | Admitting: Obstetrics & Gynecology

## 2015-05-21 VITALS — BP 120/80 | Ht 63.0 in | Wt 187.0 lb

## 2015-05-21 DIAGNOSIS — N3010 Interstitial cystitis (chronic) without hematuria: Secondary | ICD-10-CM

## 2015-05-21 NOTE — Progress Notes (Signed)
Patient ID: Marissa Trevino, female   DOB: 17-Mar-1975, 40 y.o.   MRN: [id number] Diagnosed with IC: 2 years  Current Meds:  DMSO Dietary restrictions    Pt states her symptoms have been stable, no exacerbations, although not perfect Wants to continue on this cycle  Blood pressure 120/80, height 5\' 3"  (1.6 m), weight 187 lb (84.823 kg), last menstrual period 05/02/2015.    The external urethra meatus was prepped with betadine DMSO 50 cc was instilled in the usual fashion after the bladder was catheterized and emptied completely 50cc was instilled into the bladder without difficulty and the patient tolerated well She will refrain from voiding as long as possible  Follow up in 3 weeks, or as patient requests based on her symptom complex

## 2015-05-22 ENCOUNTER — Ambulatory Visit: Payer: 59 | Admitting: Obstetrics & Gynecology

## 2015-05-27 MED FILL — MYRBETRIQ ER 50 MG TABLET: 50 | 30 days supply | Qty: 30 | Fill #1

## 2015-06-10 MED FILL — ELMIRON 100 MG CAPSULE: 100 | 30 days supply | Qty: 120 | Fill #2

## 2015-06-11 ENCOUNTER — Ambulatory Visit: Payer: 59 | Admitting: Obstetrics & Gynecology

## 2015-06-15 ENCOUNTER — Ambulatory Visit (INDEPENDENT_AMBULATORY_CARE_PROVIDER_SITE_OTHER): Payer: 59 | Admitting: Obstetrics & Gynecology

## 2015-06-15 ENCOUNTER — Encounter: Payer: Self-pay | Admitting: Obstetrics & Gynecology

## 2015-06-15 VITALS — BP 120/60 | HR 76 | Wt 188.0 lb

## 2015-06-15 DIAGNOSIS — N3010 Interstitial cystitis (chronic) without hematuria: Secondary | ICD-10-CM

## 2015-06-15 NOTE — Progress Notes (Signed)
Patient ID: Marissa Trevino, female   DOB: [birth year], 40 y.o.   MRN: [id number] Diagnosed with IC: 2015  Current Meds:  DMSO Dietary restrictions    Pt states her symptoms have been stable, no exacerbations, although not perfect Wants to continue on this cycle  Blood pressure 120/60, pulse 76, weight 188 lb (85.276 kg), last menstrual period 05/29/2015.    The external urethra meatus was prepped with betadine DMSO 50 cc was instilled in the usual fashion after the bladder was catheterized and emptied completely 50cc was instilled into the bladder without difficulty and the patient tolerated well She will refrain from voiding as long as possible  Follow up in 4 weeks, or as patient requests based on her symptom complex

## 2015-06-25 DIAGNOSIS — H9211 Otorrhea, right ear: Secondary | ICD-10-CM | POA: Diagnosis not present

## 2015-07-02 ENCOUNTER — Other Ambulatory Visit: Payer: Self-pay | Admitting: Adult Health

## 2015-07-13 ENCOUNTER — Encounter: Payer: Self-pay | Admitting: Obstetrics & Gynecology

## 2015-07-13 ENCOUNTER — Ambulatory Visit (INDEPENDENT_AMBULATORY_CARE_PROVIDER_SITE_OTHER): Payer: 59 | Admitting: Obstetrics & Gynecology

## 2015-07-13 VITALS — BP 116/80 | HR 76 | Wt 191.0 lb

## 2015-07-13 DIAGNOSIS — N3010 Interstitial cystitis (chronic) without hematuria: Secondary | ICD-10-CM | POA: Diagnosis not present

## 2015-07-13 NOTE — Progress Notes (Signed)
Patient ID: Marissa Trevino, female   DOB: December 02, 1975, 40 y.o.   MRN: [id number] Patient ID: Marissa Trevino, female   DOB: May 07, 1975, 40 y.o.   MRN: [id number] Diagnosed with IC: 2015  Current Meds:  DMSO Dietary restrictions    Pt states her symptoms have been stable, no exacerbations, although not perfect Wants to continue on this cycle  Blood pressure 116/80, pulse 76, weight 191 lb (86.637 kg), last menstrual period 05/29/2015.    The external urethra meatus was prepped with betadine DMSO 50 cc was instilled in the usual fashion after the bladder was catheterized and emptied completely 50cc was instilled into the bladder without difficulty and the patient tolerated well She will refrain from voiding as long as possible  Follow up in 6 weeks, or as patient requests based on her symptom complex

## 2015-08-14 MED FILL — ELMIRON 100 MG CAPSULE: 100 | 30 days supply | Qty: 120 | Fill #3

## 2015-08-24 ENCOUNTER — Ambulatory Visit: Payer: 59 | Admitting: Obstetrics & Gynecology

## 2015-08-24 ENCOUNTER — Encounter: Payer: Self-pay | Admitting: Obstetrics & Gynecology

## 2015-08-28 ENCOUNTER — Encounter: Payer: Self-pay | Admitting: Internal Medicine

## 2015-10-01 ENCOUNTER — Other Ambulatory Visit: Payer: Self-pay | Admitting: Adult Health

## 2015-10-01 DIAGNOSIS — Z1231 Encounter for screening mammogram for malignant neoplasm of breast: Secondary | ICD-10-CM

## 2015-10-15 ENCOUNTER — Ambulatory Visit (HOSPITAL_COMMUNITY)
Admission: RE | Admit: 2015-10-15 | Discharge: 2015-10-15 | Disposition: A | Discharge status: Home / Self Care | Payer: 59 | Source: Ambulatory Visit | Attending: Adult Health | Admitting: Adult Health

## 2015-10-15 DIAGNOSIS — Z1231 Encounter for screening mammogram for malignant neoplasm of breast: Secondary | ICD-10-CM | POA: Insufficient documentation

## 2015-10-15 DIAGNOSIS — H52223 Regular astigmatism, bilateral: Secondary | ICD-10-CM | POA: Diagnosis not present

## 2015-10-15 DIAGNOSIS — H5213 Myopia, bilateral: Secondary | ICD-10-CM | POA: Diagnosis not present

## 2015-10-22 MED FILL — ELMIRON 100 MG CAPSULE: 100 | 30 days supply | Qty: 120 | Fill #4

## 2015-12-25 HISTORY — PX: LITHOTRIPSY: SUR834

## 2016-01-02 ENCOUNTER — Encounter (HOSPITAL_COMMUNITY): Payer: Self-pay | Admitting: *Deleted

## 2016-01-02 ENCOUNTER — Emergency Department (HOSPITAL_COMMUNITY): Payer: 59

## 2016-01-02 ENCOUNTER — Emergency Department (HOSPITAL_COMMUNITY)
Admission: EM | Admit: 2016-01-02 | Discharge: 2016-01-02 | Disposition: A | Discharge status: Home / Self Care | Payer: 59 | Attending: Emergency Medicine | Admitting: Emergency Medicine

## 2016-01-02 DIAGNOSIS — N202 Calculus of kidney with calculus of ureter: Secondary | ICD-10-CM | POA: Diagnosis not present

## 2016-01-02 DIAGNOSIS — N189 Chronic kidney disease, unspecified: Secondary | ICD-10-CM | POA: Diagnosis not present

## 2016-01-02 DIAGNOSIS — N201 Calculus of ureter: Secondary | ICD-10-CM | POA: Diagnosis not present

## 2016-01-02 DIAGNOSIS — Z79899 Other long term (current) drug therapy: Secondary | ICD-10-CM | POA: Insufficient documentation

## 2016-01-02 DIAGNOSIS — R1032 Left lower quadrant pain: Secondary | ICD-10-CM | POA: Diagnosis present

## 2016-01-02 DIAGNOSIS — Z85828 Personal history of other malignant neoplasm of skin: Secondary | ICD-10-CM | POA: Diagnosis not present

## 2016-01-02 DIAGNOSIS — N132 Hydronephrosis with renal and ureteral calculous obstruction: Secondary | ICD-10-CM | POA: Diagnosis not present

## 2016-01-02 DIAGNOSIS — N23 Unspecified renal colic: Secondary | ICD-10-CM

## 2016-01-02 LAB — URINALYSIS, ROUTINE W REFLEX MICROSCOPIC
BACTERIA UA: NONE SEEN
Bilirubin Urine: NEGATIVE
Glucose, UA: NEGATIVE mg/dL
Ketones, ur: NEGATIVE mg/dL
Leukocytes, UA: NEGATIVE
NITRITE: NEGATIVE
PH: 5 (ref 5.0–8.0)
Protein, ur: 30 mg/dL — AB
SPECIFIC GRAVITY, URINE: 1.023 (ref 1.005–1.030)

## 2016-01-02 LAB — PREGNANCY, URINE: PREG TEST UR: NEGATIVE

## 2016-01-02 MED ORDER — OXYCODONE-ACETAMINOPHEN 5-325 MG PO TABS
ORAL_TABLET | ORAL | 0 refills | Status: DC
Start: 2016-01-02 — End: 2016-02-24

## 2016-01-02 MED ORDER — ONDANSETRON HCL 4 MG PO TABS: 4.0000 mg | ORAL_TABLET | Freq: Three times a day (TID) | ORAL | 0 refills | Status: DC | PRN

## 2016-01-02 MED ORDER — TAMSULOSIN HCL 0.4 MG PO CAPS: 0.4000 mg | ORAL_CAPSULE | Freq: Every day | ORAL | 0 refills | Status: DC

## 2016-01-02 MED ORDER — OXYCODONE-ACETAMINOPHEN 5-325 MG PO TABS
2.0000 | ORAL_TABLET | Freq: Once | ORAL | Status: AC
  Administered 2016-01-02: 2 via ORAL
  Filled 2016-01-02: qty 2

## 2016-01-02 MED ORDER — NAPROXEN 250 MG PO TABS: 250.0000 mg | ORAL_TABLET | Freq: Two times a day (BID) | ORAL | 0 refills | Status: DC | PRN

## 2016-01-02 MED ORDER — ONDANSETRON 8 MG PO TBDP
8.0000 mg | ORAL_TABLET | Freq: Once | ORAL | Status: AC
  Administered 2016-01-02: 8 mg via ORAL
  Filled 2016-01-02: qty 1

## 2016-01-02 MED ORDER — NAPROXEN 250 MG PO TABS: 250.0000 mg | ORAL_TABLET | Freq: Two times a day (BID) | ORAL | 0 refills | Status: DC

## 2016-01-02 NOTE — ED Notes (Signed)
Patient transported to CT 

## 2016-01-02 NOTE — ED Triage Notes (Signed)
Pt c/o left flank pain that wraps around into LLQ of abdomen, started around 1245 today. Vomiting x 5. Denies fever, urinary frequency/urgency, hematuria.

## 2016-01-02 NOTE — ED Provider Notes (Signed)
[facility] DEPT Provider Note   CSN: [id number] Arrival date & time: 01/02/16  1504     History   Chief Complaint Chief Complaint  Patient presents with  . Flank Pain    HPI Marissa Trevino is a 40 y.o. female.  HPI  Pt was seen at 1545. Per pt, c/o sudden onset and persistence of waxing and waning left sided flank "pain" that began this afternoon PTA.  Pt describes the pain as "like my last kidney stone," and radiating into the left side of her abd.  Has been associated with multiple intermittent episodes of N/V.  Denies vaginal bleeding/discharge, no dysuria/hematuria, no abd pain, no diarrhea, no black or blood in emesis, no CP/SOB, no fevers, no rash.    Past Medical History:  Diagnosis Date  . Abnormal uterine bleeding (AUB) 09/15/2014  . Arthritis   . BV (bacterial vaginosis) 05/02/2012  . Cancer ([place])    skin  . Chronic kidney disease    kidney stones  . Fibroadenoma of left breast 11/03/2014   Had bx 10/28/14  . Hematuria 12/18/2012  . Herpes simplex without mention of complication   . History of kidney stones 11/14/2014  . IC (interstitial cystitis) 02/23/2015  . Interstitial cystitis   . Pelvic pain in female 09/15/2014  . PONV (postoperative nausea and vomiting)   . Trichimoniasis 12/10/2014  . Urinary frequency 11/14/2014  . UTI (lower urinary tract infection) 12/18/2012  . Vaginal discharge 12/10/2014  . Vaginal irritation 12/10/2014    Patient Active Problem List   Diagnosis Date Noted  . IC (interstitial cystitis) 02/23/2015  . Vaginal irritation 12/10/2014  . Vaginal discharge 12/10/2014  . Trichimoniasis 12/10/2014  . Urinary frequency 11/14/2014  . History of kidney stones 11/14/2014  . Fibroadenoma of left breast 11/03/2014  . Renal colic on left side [id number]  . Pelvic pain in female 09/15/2014  . Abnormal uterine bleeding (AUB) 09/15/2014  . Chronic interstitial cystitis 01/31/2013  . Hematuria 12/18/2012  . UTI (lower urinary tract  infection) 12/18/2012  . Retroversion, uterus 05/24/2012  . BV (bacterial vaginosis) 05/02/2012  . DERMATITIS, [place] 07/09/2008  . CANDIDIASIS, VAGINAL 05/30/2008  . DYSURIA 05/30/2008  . BRONCHITIS, ACUTE WITH BRONCHOSPASM 10/10/2007  . SCIATICA 02/01/2007  . GERD 02/27/2006  . Constipation 02/27/2006  . SKIN LESION 02/27/2006    Past Surgical History:  Procedure Laterality Date  . CESAREAN SECTION    . CHOLECYSTECTOMY  16 years ago   [facility]  . CYSTOSCOPY WITH RETROGRADE PYELOGRAM, URETEROSCOPY AND STENT PLACEMENT Left 10/07/2014   Procedure: CYSTOSCOPY WITH LEFT RETROGRADE PYELOGRAM, URETEROSCOPY AND LEFT STENT PLACEMENT;  Surgeon: Cleon Gustin, MD;  Location: [facility];  Service: Urology;  Laterality: Left;  . DILITATION & CURRETTAGE/HYSTROSCOPY WITH THERMACHOICE ABLATION N/A 03/09/2012   Procedure: DILATATION & CURETTAGE/HYSTEROSCOPY WITH THERMACHOICE ABLATION;  Surgeon: Florian Buff, MD;  Location: [facility];  Service: Gynecology;  Laterality: N/A;  D5W in -33ml,out 68ml, Total therapy time:39min 39sec   . LITHOTRIPSY  10/09/14  . TONSILLECTOMY AND ADENOIDECTOMY    . TUBAL LIGATION      OB History    Gravida Para Term Preterm AB Living   3 3       3    SAB TAB Ectopic Multiple Live Births           3       Home Medications    Prior to Admission medications   Medication Sig Start Date End Date Taking? Authorizing Provider  cetirizine (  ZYRTEC) 10 MG tablet Take 10 mg by mouth at bedtime.    Yes Historical Provider, MD  Meth-Hyo-M Bl-Na Phos-Ph Sal (URIBEL) 118 MG CAPS Take 118 mg by mouth 3 (three) times daily as needed (for urinary frequency).   Yes Historical Provider, MD  mirabegron ER (MYRBETRIQ) 50 MG TB24 tablet Take 50 mg by mouth daily as needed (for urinary frequency).   Yes Historical Provider, MD  pentosan polysulfate (ELMIRON) 100 MG capsule Take 200 mg by mouth 2 (two) times daily.   Yes Historical Provider, MD    Family History Family History  Problem  Relation Age of Onset  . Hypertension Mother   . Heart disease Mother   . Hypertension Father   . Heart disease Father   . Heart attack Father   . Lung cancer Father   . Cancer Father   . Cancer Maternal Grandmother     breast  . Diabetes Paternal Grandmother   . Cancer Paternal Grandfather     lung  . Heart attack Brother   . Hypertension Brother   . Hyperlipidemia Brother   . Other Brother     brain tumor  . Colon cancer Neg Hx     Social History Social History  Substance Use Topics  . Smoking status: Never Smoker  . Smokeless tobacco: Never Used  . Alcohol use No     Allergies   Codeine   Review of Systems Review of Systems ROS: Statement: All systems negative except as marked or noted in the HPI; Constitutional: Negative for fever and chills. ; ; Eyes: Negative for eye pain, redness and discharge. ; ; ENMT: Negative for ear pain, hoarseness, nasal congestion, sinus pressure and sore throat. ; ; Cardiovascular: Negative for chest pain, palpitations, diaphoresis, dyspnea and peripheral edema. ; ; Respiratory: Negative for cough, wheezing and stridor. ; ; Gastrointestinal: +N/V. Negative for diarrhea, abdominal pain, blood in stool, hematemesis, jaundice and rectal bleeding. . ; ; Genitourinary: +flank pain. Negative for dysuria and hematuria. ; ; GYN:  No pelvic pain, no vaginal bleeding, no vaginal discharge, no vulvar pain. ;; Musculoskeletal: Negative for back pain and neck pain. Negative for swelling and trauma.; ; Skin: Negative for pruritus, rash, abrasions, blisters, bruising and skin lesion.; ; Neuro: Negative for headache, lightheadedness and neck stiffness. Negative for weakness, altered level of consciousness, altered mental status, extremity weakness, paresthesias, involuntary movement, seizure and syncope.       Physical Exam Updated Vital Signs BP 141/81 (BP Location: Left Arm)   Pulse 95   Temp 98.1 F (36.7 C) (Oral)   Resp 16   Ht 5\' 3"  (1.6 m)   Wt  190 lb (86.2 kg)   LMP 12/20/2015   SpO2 98%   BMI 33.66 kg/m   Physical Exam 1550: Physical examination:  Nursing notes reviewed; Vital signs and O2 SAT reviewed;  Constitutional: Well developed, Well nourished, Well hydrated, In no acute distress; Head:  Normocephalic, atraumatic; Eyes: EOMI, PERRL, No scleral icterus; ENMT: Mouth and pharynx normal, Mucous membranes moist; Neck: Supple, Full range of motion, No lymphadenopathy; Cardiovascular: Regular rate and rhythm, No gallop; Respiratory: Breath sounds clear & equal bilaterally, No rales, rhonchi, wheezes.  Speaking full sentences with ease, Normal respiratory effort/excursion; Chest: Nontender, Movement normal; Abdomen: Soft, Nontender, Nondistended, Normal bowel sounds; Genitourinary: No CVA tenderness;; Extremities: Pulses normal, No tenderness, No edema, No calf edema or asymmetry.; Neuro: AA&Ox3, Major CN grossly intact.  Speech clear. No gross focal motor or sensory deficits in  extremities.; Skin: Color normal, Warm, Dry.   ED Treatments / Results  Labs (all labs ordered are listed, but only abnormal results are displayed)   EKG  EKG Interpretation None       Radiology   Procedures Procedures (including critical care time)  Medications Ordered in ED Medications  oxyCODONE-acetaminophen (PERCOCET/ROXICET) 5-325 MG per tablet 2 tablet (not administered)  ondansetron (ZOFRAN-ODT) disintegrating tablet 8 mg (not administered)     Initial Impression / Assessment and Plan / ED Course  I have reviewed the triage vital signs and the nursing notes.  Pertinent labs & imaging results that were available during my care of the patient were reviewed by me and considered in my medical decision making (see chart for details).  MDM Reviewed: previous chart, nursing note and vitals Interpretation: labs and CT scan   Results for orders placed or performed during the hospital encounter of 01/02/16  Urinalysis, Routine w reflex  microscopic  Result Value Ref Range   Color, Urine YELLOW YELLOW   APPearance CLOUDY (A) CLEAR   Specific Gravity, Urine 1.023 1.005 - 1.030   pH 5.0 5.0 - 8.0   Glucose, UA NEGATIVE NEGATIVE mg/dL   Hgb urine dipstick MODERATE (A) NEGATIVE   Bilirubin Urine NEGATIVE NEGATIVE   Ketones, ur NEGATIVE NEGATIVE mg/dL   Protein, ur 30 (A) NEGATIVE mg/dL   Nitrite NEGATIVE NEGATIVE   Leukocytes, UA NEGATIVE NEGATIVE   RBC / HPF 6-30 0 - 5 RBC/hpf   WBC, UA 6-30 0 - 5 WBC/hpf   Bacteria, UA NONE SEEN NONE SEEN   Mucous PRESENT    Ca Oxalate Crys, UA PRESENT   Pregnancy, urine  Result Value Ref Range   Preg Test, Ur NEGATIVE NEGATIVE   Ct Renal Stone Study Result Date: 01/02/2016 CLINICAL DATA:  Left-sided flank pain, history of calculi EXAM: CT ABDOMEN AND PELVIS WITHOUT CONTRAST TECHNIQUE: Multidetector CT imaging of the abdomen and pelvis was performed following the standard protocol without IV contrast. COMPARISON:  10/04/2014 FINDINGS: Lower chest: No acute abnormality. Stable left breast mass is again seen. This by history is a fibroadenoma. Hepatobiliary: No focal liver abnormality is seen. Status post cholecystectomy. No biliary dilatation. Pancreas: Unremarkable. No pancreatic ductal dilatation or surrounding inflammatory changes. Spleen: Normal in size without focal abnormality. Adrenals/Urinary Tract: The adrenal glands are within normal limits. The right kidney shows no evidence of hydronephrosis or renal calculi. The right ureter is within normal limits. The bladder is well distended. The left kidney demonstrates 2 small lower pole calculi measuring approximately 5 mm. Hydronephrosis is noted secondary to a proximal left ureteral stone which measures 9 mm. The more distal left ureter is within normal limits. Stomach/Bowel: The appendix is not well visualized. No inflammatory changes of the bowel are seen. No obstructive changes are noted. Vascular/Lymphatic: No significant vascular  findings are present. No enlarged abdominal or pelvic lymph nodes. Reproductive: Changes of prior tubal ligation are noted. Follicular changes are noted in the ovaries bilaterally. The uterus appears within normal limits. Other: No abdominal wall hernia or abnormality. No abdominopelvic ascites. Musculoskeletal: No acute or significant osseous findings. IMPRESSION: Nonobstructing left renal stones. Proximal left ureteral stone with obstructive changes as described. No other acute abnormality is noted. Electronically Signed   By: Inez Catalina M.D.   On: 01/02/2016 17:26    1815:  No UTI on Udip. Feels better after meds and wants to go home now. Has tol PO well while in the ED without N/V. Continue to  tx symptomatically, f/u Uro MD. Dx and testing d/w pt.  Questions answered.  Verb understanding, agreeable to d/c home with outpt f/u.   Final Clinical Impressions(s) / ED Diagnoses   Final diagnoses:  None    New Prescriptions New Prescriptions   No medications on file     Francine Graven, DO 01/07/16 2033

## 2016-01-02 NOTE — ED Notes (Signed)
Pt reports history of kidney stones and gall stones. She is reporting pain relief currently

## 2016-01-02 NOTE — Discharge Instructions (Signed)
Take the prescriptions as directed. Call the Urologist on Monday to schedule a follow up appointment this week.  Return to the Emergency Department immediately if worsening. ° °

## 2016-01-04 ENCOUNTER — Ambulatory Visit (HOSPITAL_COMMUNITY)
Admission: RE | Admit: 2016-01-04 | Discharge: 2016-01-04 | Disposition: A | Discharge status: Home / Self Care | Payer: 59 | Source: Ambulatory Visit | Attending: Urology | Admitting: Urology

## 2016-01-04 ENCOUNTER — Encounter (HOSPITAL_COMMUNITY): Payer: Self-pay

## 2016-01-04 ENCOUNTER — Encounter (HOSPITAL_COMMUNITY)
Admission: RE | Disposition: A | Discharge status: Home / Self Care | Payer: Self-pay | Source: Ambulatory Visit | Attending: Urology

## 2016-01-04 ENCOUNTER — Other Ambulatory Visit: Payer: Self-pay | Admitting: Urology

## 2016-01-04 DIAGNOSIS — Z885 Allergy status to narcotic agent status: Secondary | ICD-10-CM | POA: Diagnosis not present

## 2016-01-04 DIAGNOSIS — N201 Calculus of ureter: Secondary | ICD-10-CM | POA: Insufficient documentation

## 2016-01-04 DIAGNOSIS — Z9049 Acquired absence of other specified parts of digestive tract: Secondary | ICD-10-CM | POA: Diagnosis not present

## 2016-01-04 DIAGNOSIS — Z79899 Other long term (current) drug therapy: Secondary | ICD-10-CM | POA: Diagnosis not present

## 2016-01-04 DIAGNOSIS — Z801 Family history of malignant neoplasm of trachea, bronchus and lung: Secondary | ICD-10-CM | POA: Diagnosis not present

## 2016-01-04 DIAGNOSIS — N202 Calculus of kidney with calculus of ureter: Secondary | ICD-10-CM | POA: Diagnosis not present

## 2016-01-04 SURGERY — LITHOTRIPSY, ESWL
Anesthesia: LOCAL | Laterality: Left

## 2016-01-04 MED ORDER — CIPROFLOXACIN HCL 500 MG PO TABS
500.0000 mg | ORAL_TABLET | ORAL | Status: AC
  Administered 2016-01-04: 500 mg via ORAL
  Filled 2016-01-04: qty 1

## 2016-01-04 MED ORDER — DIAZEPAM 5 MG PO TABS
10.0000 mg | ORAL_TABLET | ORAL | Status: AC
  Administered 2016-01-04: 10 mg via ORAL
  Filled 2016-01-04: qty 2

## 2016-01-04 MED ORDER — DIPHENHYDRAMINE HCL 25 MG PO CAPS
25.0000 mg | ORAL_CAPSULE | ORAL | Status: AC
  Administered 2016-01-04: 25 mg via ORAL
  Filled 2016-01-04: qty 1

## 2016-01-04 MED ORDER — SODIUM CHLORIDE 0.9 % IV SOLN
INTRAVENOUS | Status: DC
  Administered 2016-01-04: 17:00:00 via INTRAVENOUS

## 2016-01-04 NOTE — BH Specialist Note (Signed)
PACU Nursing Note: Pt post Litho from Left Kidney, pt alert and oriented, ambulated to bathroom without assistance, gait very steady, tolerating PO fluids very well, voices no complaints. Pt states she feels "sore" at left flank, but states has pain med at home and will take pain med after getting home. Pt teaching done with family/ brother at side: (1) safety while taking PO pain med (2) post procedure diet (3) when to call MD, ie, pain unrelieved, unable to void (4) to make and keep follow up appoint Pt provided DC instruction sheet post Litho, opportunity for questions provided. Pt escorted to exit, brother here to drive pt home. Teach Back Method used

## 2016-01-04 NOTE — Discharge Instructions (Signed)
Lithotripsy, Care After °Refer to this sheet in the next few weeks. These instructions provide you with information on caring for yourself after your procedure. Your health care provider may also give you more specific instructions. Your treatment has been planned according to current medical practices, but problems sometimes occur. Call your health care provider if you have any problems or questions after your procedure. °WHAT TO EXPECT AFTER THE PROCEDURE  °· Your urine may have a red tinge for a few days after treatment. Blood loss is usually minimal. °· You may have soreness in the back or flank area. This usually goes away after a few days. The procedure can cause blotches or bruises on the back where the pressure wave enters the skin. These marks usually cause only minimal discomfort and should disappear in a short time. °· Stone fragments should begin to pass within 24 hours of treatment. However, a delayed passage is not unusual. °· You may have pain, discomfort, and feel sick to your stomach (nauseated) when the crushed fragments of stone are passed down the tube from the kidney to the bladder. Stone fragments can pass soon after the procedure and may last for up to 4-8 weeks. °· A small number of patients may have severe pain when stone fragments are not able to pass, which leads to an obstruction. °· If your stone is greater than 1 inch (2.5 cm) in diameter or if you have multiple stones that have a combined diameter greater than 1 inch (2.5 cm), you may require more than one treatment. °· If you had a stent placed prior to your procedure, you may experience some discomfort, especially during urination. You may experience the pain or discomfort in your flank or back, or you may experience a sharp pain or discomfort at the base of your penis or in your lower abdomen. The discomfort usually lasts only a few minutes after urinating. °HOME CARE INSTRUCTIONS  °· Rest at home until you feel your energy  improving. °· Only take over-the-counter or prescription medicines for pain, discomfort, or fever as directed by your health care provider. Depending on the type of lithotripsy, you may need to take antibiotics and anti-inflammatory medicines for a few days. °· Drink enough water and fluids to keep your urine clear or pale yellow. This helps "flush" your kidneys. It helps pass any remaining pieces of stone and prevents stones from coming back. °· Most people can resume daily activities within 1-2 days after standard lithotripsy. It can take longer to recover from laser and percutaneous lithotripsy. °· Strain all urine through the provided strainer. Keep all particulate matter and stones for your health care provider to see. The stone may be as small as a grain of salt. It is very important to use the strainer each and every time you pass your urine. Any stones that are found can be sent to a medical lab for examination. °· Visit your health care provider for a follow-up appointment in a few weeks. Your doctor may remove your stent if you have one. Your health care provider will also check to see whether stone particles still remain. °SEEK MEDICAL CARE IF:  °· Your pain is not relieved by medicine. °· You have a lasting nauseous feeling. °· You feel there is too much blood in the urine. °· You develop persistent problems with frequent or painful urination that does not at least partially improve after 2 days following the procedure. °· You have a congested cough. °· You feel   lightheaded.  You develop a rash or any other signs that might suggest an allergic problem.  You develop any reaction or side effects to your medicine(s). SEEK IMMEDIATE MEDICAL CARE IF:   You experience severe back or flank pain or both.  You see nothing but blood when you urinate.  You cannot pass any urine at all.  You have a fever or shaking chills.  You develop shortness of breath, difficulty breathing, or chest pain.  You  develop vomiting that will not stop after 6-8 hours.  You have a fainting episode. This information is not intended to replace advice given to you by your health care provider. Make sure you discuss any questions you have with your health care provider. Document Released: 01/30/2007 Document Revised: 10/01/2014 Document Reviewed: 07/26/2012 Elsevier Interactive Patient Education  2017 [employer].

## 2016-01-04 NOTE — H&P (Signed)
CC: I have ureteral stone.  HPI: Marissa Trevino is a 40 year-old female established patient who is here for ureteral stone.  The problem is on the left side. She first stated noticing pain on approximately 01/02/2016. This is not her first kidney stone. She is currently having flank pain, nausea, and vomiting. She denies having back pain, groin pain, fever, and chills. Pain is occuring on the left side. She has not caught a stone in her urine strainer since her symptoms began.   She has had eswl and ureteroscopy for treatment of her stones in the past.   Marissa Trevino returns today with the onset Saturday of left flank pain. The pain was severe with Nausea and vomiting and she went to the ER. she was found ot have 17m left proximal stone and a 690mLLP stone. Her pain persists but has improved. She has had some frequency but no hematuria. She has had no fever or chills. She had stenting and ESWL for a left ureteral stone last year.      ALLERGIES: Codeine Derivatives    MEDICATIONS: Hydrocodone-Acetaminophen TABS Oral  Zofran TABS Oral  ZyrTEC Allergy TABS Oral     GU PSH: Cystoscopy Insert Stent - 10/22/2014 Cystoscopy Ureteroscopy - 10/22/2014 Renal ESWL - 10/10/2014      PSH Notes: Cystoscopy With Ureteroscopy Left, Cystoscopy With Insertion Of Ureteral Stent Left, Lithotripsy, Cesarean Section, Cholecystectomy   NON-GU PSH: Cesarean Delivery Only - 09/2014/09/26holecystectomy - 09/2014/09/26  GU PMH: Calculus Ureter, Calculus of proximal left ureter - 10/13/2014 Interstitial Cystitis, chronic w/o hematuria, Interstitial cystitis - 09/2014-09-26idney Stone, Left nephrolithiasis - 9/September 26, 2016ersonal Hx urinary calculi, History of renal calculi - 9/09-26-2016  NON-GU PMH: Encounter for general adult medical examination without abnormal findings, Encounter for preventive health examination - 09/2014-09-26ersonal history of other diseases of the digestive system, History of esophageal reflux -  09/2014/09/26ersonal history of other diseases of the musculoskeletal system and connective tissue, History of arthritis - 9/09-26-16  FAMILY HISTORY: Deceased - Runs In Family Lung Cancer - Runs In Family   SOCIAL HISTORY: None    Notes: Never smoker, Caffeine use, Occupation, Single, Number of children, Non-smoker   REVIEW OF SYSTEMS:    GU Review Female:   Patient reports get up at night to urinate. Patient denies frequent urination, hard to postpone urination, burning /pain with urination, leakage of urine, stream starts and stops, trouble starting your stream, have to strain to urinate, and currently pregnant.  Gastrointestinal (Upper):   Patient reports nausea and vomiting. Patient denies indigestion/ heartburn.  Gastrointestinal (Lower):   Patient denies diarrhea and constipation.  Constitutional:   Patient denies fever, night sweats, weight loss, and fatigue.  Skin:   Patient denies skin rash/ lesion and itching.  Eyes:   Patient denies blurred vision and double vision.  Ears/ Nose/ Throat:   Patient reports sinus problems. Patient denies sore throat.  Hematologic/Lymphatic:   Patient denies swollen glands and easy bruising.  Cardiovascular:   Patient denies leg swelling and chest pains.  Respiratory:   Patient reports cough. Patient denies shortness of breath.  Endocrine:   Patient denies excessive thirst.  Musculoskeletal:   Patient reports back pain. Patient denies joint pain.  Neurological:   Patient reports headaches. Patient denies dizziness.  Psychologic:   Patient denies depression and anxiety.   VITAL SIGNS:      01/04/2016 10:17 AM  Weight 190 lb / 86.18 kg  Height 63  in / 160.02 cm  BP 115/79 mmHg  Pulse 78 /min  Temperature 97.8 F / 37 C  BMI 33.7 kg/m   MULTI-SYSTEM PHYSICAL EXAMINATION:    Constitutional: Well-nourished. No physical deformities. Normally developed. Good grooming.  Respiratory: No labored breathing, no use of accessory muscles. CTA   Cardiovascular: Normal temperature, . RRR without murmur.  Gastrointestinal: Abdominal tenderness mild LLQ. No mass, no rigidity, obese abdomen.      PAST DATA REVIEWED:  Source Of History:  Patient   PROCEDURES:         KUB - 74000  A single view of the abdomen is obtained. She has a 5x666m left proximal stone and a 660mLLP stone. No other bone, gas or soft tissue abnormalities are noted.      66m75meft proximal stone and 6mm33mP stone.         Urinalysis w/Scope - 81001 Dipstick Dipstick Cont'd Micro  Specimen: Voided Bilirubin: Neg WBC/hpf: 0 - 5/hpf  Color: Amber Ketones: Neg RBC/hpf: 3 - 10/hpf  Appearance: Cloudy Blood: 2+ Bacteria: Few (10-25/hpf)  Specific Gravity: 1.025 Protein: Trace Cystals: NS (Not Seen)  pH: 6.0 Urobilinogen: 0.2 Casts: NS (Not Seen)  Glucose: Neg Nitrites: Neg Trichomonas: Not Present    Leukocyte Esterase: Neg Mucous: Not Present      Epithelial Cells: 10 - 20/hpf      Yeast: NS (Not Seen)      Sperm: Not Present    ASSESSMENT:      ICD-10 Details  1 GU:   Calculus Ureter - N20.1   2   Kidney Stone - N20.0    PLAN:           Orders X-Rays: KUB          Schedule         Document Letter(s):  Created for Patient: Clinical Summary         Notes:   Her stone hasn't moved.   I reviewed the options including MET, ESWL and ureteroscopy and she would like to proceed with ESWL. I have reviewed the risk in detail.

## 2016-01-05 ENCOUNTER — Emergency Department (HOSPITAL_COMMUNITY)
Admission: EM | Admit: 2016-01-05 | Discharge: 2016-01-05 | Disposition: A | Discharge status: Home / Self Care | Payer: 59 | Attending: Emergency Medicine | Admitting: Emergency Medicine

## 2016-01-05 ENCOUNTER — Emergency Department (HOSPITAL_COMMUNITY): Payer: 59

## 2016-01-05 ENCOUNTER — Encounter (HOSPITAL_COMMUNITY): Payer: Self-pay | Admitting: *Deleted

## 2016-01-05 DIAGNOSIS — N200 Calculus of kidney: Secondary | ICD-10-CM | POA: Diagnosis not present

## 2016-01-05 DIAGNOSIS — Z79899 Other long term (current) drug therapy: Secondary | ICD-10-CM | POA: Insufficient documentation

## 2016-01-05 DIAGNOSIS — Z85828 Personal history of other malignant neoplasm of skin: Secondary | ICD-10-CM | POA: Insufficient documentation

## 2016-01-05 DIAGNOSIS — N23 Unspecified renal colic: Secondary | ICD-10-CM | POA: Insufficient documentation

## 2016-01-05 DIAGNOSIS — R1012 Left upper quadrant pain: Secondary | ICD-10-CM | POA: Diagnosis present

## 2016-01-05 DIAGNOSIS — N189 Chronic kidney disease, unspecified: Secondary | ICD-10-CM | POA: Insufficient documentation

## 2016-01-05 LAB — CBC WITH DIFFERENTIAL/PLATELET
Basophils Absolute: 0 10*3/uL (ref 0.0–0.1)
Basophils Relative: 0 %
Eosinophils Absolute: 0 10*3/uL (ref 0.0–0.7)
Eosinophils Relative: 0 %
HCT: 39.3 % (ref 36.0–46.0)
HEMOGLOBIN: 13.4 g/dL (ref 12.0–15.0)
LYMPHS ABS: 1.5 10*3/uL (ref 0.7–4.0)
LYMPHS PCT: 9 %
MCH: 30.9 pg (ref 26.0–34.0)
MCHC: 34.1 g/dL (ref 30.0–36.0)
MCV: 90.6 fL (ref 78.0–100.0)
MONOS PCT: 7 %
Monocytes Absolute: 1 10*3/uL (ref 0.1–1.0)
NEUTROS PCT: 84 %
Neutro Abs: 13.1 10*3/uL — ABNORMAL HIGH (ref 1.7–7.7)
Platelets: 256 10*3/uL (ref 150–400)
RBC: 4.34 MIL/uL (ref 3.87–5.11)
RDW: 12 % (ref 11.5–15.5)
WBC: 15.6 10*3/uL — AB (ref 4.0–10.5)

## 2016-01-05 LAB — BASIC METABOLIC PANEL
Anion gap: 8 (ref 5–15)
BUN: 15 mg/dL (ref 6–20)
CHLORIDE: 105 mmol/L (ref 101–111)
CO2: 24 mmol/L (ref 22–32)
Calcium: 9.3 mg/dL (ref 8.9–10.3)
Creatinine, Ser: 0.67 mg/dL (ref 0.44–1.00)
GFR calc non Af Amer: >60 mL/min (ref >60)
Glucose, Bld: 105 mg/dL — ABNORMAL HIGH (ref 65–99)
POTASSIUM: 3.7 mmol/L (ref 3.5–5.1)
SODIUM: 137 mmol/L (ref 135–145)

## 2016-01-05 LAB — URINALYSIS, ROUTINE W REFLEX MICROSCOPIC
BILIRUBIN URINE: NEGATIVE
Glucose, UA: NEGATIVE mg/dL
Ketones, ur: 80 mg/dL — AB
Leukocytes, UA: NEGATIVE
NITRITE: NEGATIVE
PH: 6 (ref 5.0–8.0)
Protein, ur: 100 mg/dL — AB
SPECIFIC GRAVITY, URINE: 1.019 (ref 1.005–1.030)

## 2016-01-05 LAB — PREGNANCY, URINE: Preg Test, Ur: NEGATIVE

## 2016-01-05 MED ORDER — FENTANYL CITRATE (PF) 100 MCG/2ML IJ SOLN
50.0000 ug | Freq: Once | INTRAMUSCULAR | Status: AC
  Administered 2016-01-05: 50 ug via INTRAVENOUS
  Filled 2016-01-05: qty 2

## 2016-01-05 MED ORDER — KETOROLAC TROMETHAMINE 10 MG PO TABS: 10.0000 mg | ORAL_TABLET | Freq: Four times a day (QID) | ORAL | 0 refills | Status: DC | PRN

## 2016-01-05 MED ORDER — ONDANSETRON HCL 4 MG/2ML IJ SOLN
4.0000 mg | Freq: Once | INTRAMUSCULAR | Status: AC
  Administered 2016-01-05: 4 mg via INTRAVENOUS
  Filled 2016-01-05: qty 2

## 2016-01-05 MED ORDER — ONDANSETRON 4 MG PO TBDP: 4.0000 mg | ORAL_TABLET | Freq: Three times a day (TID) | ORAL | 0 refills | Status: DC | PRN

## 2016-01-05 MED ORDER — HYDROMORPHONE HCL 1 MG/ML IJ SOLN
1.0000 mg | Freq: Once | INTRAMUSCULAR | Status: AC
  Administered 2016-01-05: 1 mg via INTRAVENOUS
  Filled 2016-01-05: qty 1

## 2016-01-05 MED ORDER — KETOROLAC TROMETHAMINE 30 MG/ML IJ SOLN
30.0000 mg | Freq: Once | INTRAMUSCULAR | Status: AC
  Administered 2016-01-05: 30 mg via INTRAVENOUS
  Filled 2016-01-05: qty 1

## 2016-01-05 MED ORDER — TAMSULOSIN HCL 0.4 MG PO CAPS: ORAL_CAPSULE | ORAL | 0 refills | Status: DC

## 2016-01-05 NOTE — ED Provider Notes (Signed)
[facility] DEPT Provider Note   CSN: [id number] Arrival date & time: 01/05/16  0306  Time seen 03:20 AM   History   Chief Complaint Chief Complaint  Patient presents with  . Post-op Problem    HPI Marissa Trevino is a 40 y.o. female.  HPI  patient reports she had lithotripsy done earlier this evening for a left ureteral stone. She states it was done about 7:30 PM. She states she started having pain before she was discharged and continued on the way home and got worse. She complains of a lot of pain in her left upper lateral abdomen. She has had nausea and vomiting about 7 times. She states her first urine was bloody however the second one was clear. She denies fever. She states she has had lithotripsy before however this time is different  PCP Dr. Nevada Crane Urology Dr. Jeffie Pollock  Past Medical History:  Diagnosis Date  . Abnormal uterine bleeding (AUB) 09/15/2014  . Arthritis   . BV (bacterial vaginosis) 05/02/2012  . Cancer ([place])    skin  . Chronic kidney disease    kidney stones  . Fibroadenoma of left breast 11/03/2014   Had bx 10/28/14  . Hematuria 12/18/2012  . Herpes simplex without mention of complication   . History of kidney stones 11/14/2014  . IC (interstitial cystitis) 02/23/2015  . Interstitial cystitis   . Pelvic pain in female 09/15/2014  . PONV (postoperative nausea and vomiting)   . Trichimoniasis 12/10/2014  . Urinary frequency 11/14/2014  . UTI (lower urinary tract infection) 12/18/2012  . Vaginal discharge 12/10/2014  . Vaginal irritation 12/10/2014    Patient Active Problem List   Diagnosis Date Noted  . IC (interstitial cystitis) 02/23/2015  . Vaginal irritation 12/10/2014  . Vaginal discharge 12/10/2014  . Trichimoniasis 12/10/2014  . Urinary frequency 11/14/2014  . History of kidney stones 11/14/2014  . Fibroadenoma of left breast 11/03/2014  . Renal colic on left side [id number]  . Pelvic pain in female 09/15/2014  . Abnormal uterine bleeding  (AUB) 09/15/2014  . Chronic interstitial cystitis 01/31/2013  . Hematuria 12/18/2012  . UTI (lower urinary tract infection) 12/18/2012  . Retroversion, uterus 05/24/2012  . BV (bacterial vaginosis) 05/02/2012  . DERMATITIS, [place] 07/09/2008  . CANDIDIASIS, VAGINAL 05/30/2008  . DYSURIA 05/30/2008  . BRONCHITIS, ACUTE WITH BRONCHOSPASM 10/10/2007  . SCIATICA 02/01/2007  . GERD 02/27/2006  . Constipation 02/27/2006  . SKIN LESION 02/27/2006    Past Surgical History:  Procedure Laterality Date  . CESAREAN SECTION    . CHOLECYSTECTOMY  16 years ago   [facility]  . CYSTOSCOPY WITH RETROGRADE PYELOGRAM, URETEROSCOPY AND STENT PLACEMENT Left 10/07/2014   Procedure: CYSTOSCOPY WITH LEFT RETROGRADE PYELOGRAM, URETEROSCOPY AND LEFT STENT PLACEMENT;  Surgeon: Cleon Gustin, MD;  Location: [facility];  Service: Urology;  Laterality: Left;  . DILITATION & CURRETTAGE/HYSTROSCOPY WITH THERMACHOICE ABLATION N/A 03/09/2012   Procedure: DILATATION & CURETTAGE/HYSTEROSCOPY WITH THERMACHOICE ABLATION;  Surgeon: Florian Buff, MD;  Location: [facility];  Service: Gynecology;  Laterality: N/A;  D5W in -63ml,out 34ml, Total therapy time:25min 39sec   . LITHOTRIPSY  10/09/14  . TONSILLECTOMY AND ADENOIDECTOMY    . TUBAL LIGATION      OB History    Gravida Para Term Preterm AB Living   3 3       3    SAB TAB Ectopic Multiple Live Births           3       Home  Medications    Prior to Admission medications   Medication Sig Start Date End Date Taking? Authorizing Provider  cetirizine (ZYRTEC) 10 MG tablet Take 10 mg by mouth at bedtime.    Yes Historical Provider, MD  Meth-Hyo-M Bl-Na Phos-Ph Sal (URIBEL) 118 MG CAPS Take 118 mg by mouth 3 (three) times daily as needed (for urinary frequency).   Yes Historical Provider, MD  mirabegron ER (MYRBETRIQ) 50 MG TB24 tablet Take 50 mg by mouth daily as needed (for urinary frequency).   Yes Historical Provider, MD  naproxen (NAPROSYN) 250 MG tablet Take 1 tablet  (250 mg total) by mouth 2 (two) times daily with a meal. 01/02/16  Yes Francine Graven, DO  ondansetron (ZOFRAN) 4 MG tablet Take 1 tablet (4 mg total) by mouth every 8 (eight) hours as needed for nausea or vomiting. 01/02/16  Yes Francine Graven, DO  oxyCODONE-acetaminophen (PERCOCET/ROXICET) 5-325 MG tablet 1 or 2 tabs PO q6h prn pain 01/02/16  Yes Francine Graven, DO  pentosan polysulfate (ELMIRON) 100 MG capsule Take 200 mg by mouth 2 (two) times daily.   Yes Historical Provider, MD  phenylephrine (SUDAFED PE) 10 MG TABS tablet Take 20 mg by mouth every 4 (four) hours as needed (for congestion).   Yes Historical Provider, MD  ketorolac (TORADOL) 10 MG tablet Take 1 tablet (10 mg total) by mouth every 6 (six) hours as needed. 01/05/16   Rolland Porter, MD  ondansetron (ZOFRAN ODT) 4 MG disintegrating tablet Take 1 tablet (4 mg total) by mouth every 8 (eight) hours as needed for nausea or vomiting. 01/05/16   Rolland Porter, MD  tamsulosin (FLOMAX) 0.4 MG CAPS capsule Take 1 po QD until you pass the stone. 01/05/16   Rolland Porter, MD    Family History Family History  Problem Relation Age of Onset  . Hypertension Mother   . Heart disease Mother   . Hypertension Father   . Heart disease Father   . Heart attack Father   . Lung cancer Father   . Cancer Father   . Cancer Maternal Grandmother     breast  . Diabetes Paternal Grandmother   . Cancer Paternal Grandfather     lung  . Heart attack Brother   . Hypertension Brother   . Hyperlipidemia Brother   . Other Brother     brain tumor  . Colon cancer Neg Hx     Social History Social History  Substance Use Topics  . Smoking status: Never Smoker  . Smokeless tobacco: Never Used  . Alcohol use No     Allergies   Codeine   Review of Systems Review of Systems  All other systems reviewed and are negative.    Physical Exam Updated Vital Signs BP 147/87 (BP Location: Left Arm)   Pulse 111   Temp 98 F (36.7 C) (Oral)   Resp 18   Ht  5\' 3"  (1.6 m)   Wt 190 lb (86.2 kg)   LMP 12/20/2015   SpO2 97%   BMI 33.66 kg/m   Vital signs normal except for tachycardia   Physical Exam  Constitutional: She is oriented to person, place, and time. She appears well-developed and well-nourished.  Non-toxic appearance. She does not appear ill. She appears distressed.  Appears painful  HENT:  Head: Normocephalic and atraumatic.  Right Ear: External ear normal.  Left Ear: External ear normal.  Nose: Nose normal.  Mouth/Throat: Mucous membranes are normal. No dental abscesses or uvula swelling.  Eyes: Conjunctivae  and EOM are normal.  Neck: Normal range of motion and full passive range of motion without pain.  Cardiovascular: Normal rate.   Pulmonary/Chest: Effort normal. No respiratory distress. She has no rhonchi. She exhibits no crepitus.  Abdominal: Soft. Normal appearance and bowel sounds are normal. She exhibits no distension. There is tenderness. There is no rebound and no guarding.    Musculoskeletal: Normal range of motion. She exhibits no edema or tenderness.  Moves all extremities well.   Neurological: She is alert and oriented to person, place, and time. She has normal strength. No cranial nerve deficit.  Skin: Skin is warm, dry and intact. No rash noted. No erythema. No pallor.  Psychiatric: She has a normal mood and affect. Her speech is normal and behavior is normal. Her mood appears not anxious.  Nursing note and vitals reviewed.    ED Treatments / Results  Labs (all labs ordered are listed, but only abnormal results are displayed)  Results for orders placed or performed during the hospital encounter of 01/05/16  Urinalysis, Routine w reflex microscopic  Result Value Ref Range   Color, Urine YELLOW YELLOW   APPearance HAZY (A) CLEAR   Specific Gravity, Urine 1.019 1.005 - 1.030   pH 6.0 5.0 - 8.0   Glucose, UA NEGATIVE NEGATIVE mg/dL   Hgb urine dipstick LARGE (A) NEGATIVE   Bilirubin Urine NEGATIVE  NEGATIVE   Ketones, ur 80 (A) NEGATIVE mg/dL   Protein, ur 100 (A) NEGATIVE mg/dL   Nitrite NEGATIVE NEGATIVE   Leukocytes, UA NEGATIVE NEGATIVE   RBC / HPF TOO NUMEROUS TO COUNT 0 - 5 RBC/hpf   WBC, UA 6-30 0 - 5 WBC/hpf   Bacteria, UA RARE (A) NONE SEEN   Mucous PRESENT   Basic metabolic panel  Result Value Ref Range   Sodium 137 135 - 145 mmol/L   Potassium 3.7 3.5 - 5.1 mmol/L   Chloride 105 101 - 111 mmol/L   CO2 24 22 - 32 mmol/L   Glucose, Bld 105 (H) 65 - 99 mg/dL   BUN 15 6 - 20 mg/dL   Creatinine, Ser 0.67 0.44 - 1.00 mg/dL   Calcium 9.3 8.9 - 10.3 mg/dL   GFR calc non Af Amer >60 >60 mL/min   GFR calc Af Amer >60 >60 mL/min   Anion gap 8 5 - 15  CBC with Differential  Result Value Ref Range   WBC 15.6 (H) 4.0 - 10.5 K/uL   RBC 4.34 3.87 - 5.11 MIL/uL   Hemoglobin 13.4 12.0 - 15.0 g/dL   HCT 39.3 36.0 - 46.0 %   MCV 90.6 78.0 - 100.0 fL   MCH 30.9 26.0 - 34.0 pg   MCHC 34.1 30.0 - 36.0 g/dL   RDW 12.0 11.5 - 15.5 %   Platelets 256 150 - 400 K/uL   Neutrophils Relative % 84 %   Neutro Abs 13.1 (H) 1.7 - 7.7 K/uL   Lymphocytes Relative 9 %   Lymphs Abs 1.5 0.7 - 4.0 K/uL   Monocytes Relative 7 %   Monocytes Absolute 1.0 0.1 - 1.0 K/uL   Eosinophils Relative 0 %   Eosinophils Absolute 0.0 0.0 - 0.7 K/uL   Basophils Relative 0 %   Basophils Absolute 0.0 0.0 - 0.1 K/uL  Pregnancy, urine  Result Value Ref Range   Preg Test, Ur NEGATIVE NEGATIVE   Laboratory interpretation all normal except leukocytosis (vomiting), microscopic hematuria, no uti    Results for orders placed or performed  during the hospital encounter of 01/02/16  Urinalysis, Routine w reflex microscopic  Result Value Ref Range   Color, Urine YELLOW YELLOW   APPearance CLOUDY (A) CLEAR   Specific Gravity, Urine 1.023 1.005 - 1.030   pH 5.0 5.0 - 8.0   Glucose, UA NEGATIVE NEGATIVE mg/dL   Hgb urine dipstick MODERATE (A) NEGATIVE   Bilirubin Urine NEGATIVE NEGATIVE   Ketones, ur NEGATIVE  NEGATIVE mg/dL   Protein, ur 30 (A) NEGATIVE mg/dL   Nitrite NEGATIVE NEGATIVE   Leukocytes, UA NEGATIVE NEGATIVE   RBC / HPF 6-30 0 - 5 RBC/hpf   WBC, UA 6-30 0 - 5 WBC/hpf   Bacteria, UA NONE SEEN NONE SEEN   Mucous PRESENT    Ca Oxalate Crys, UA PRESENT   Pregnancy, urine  Result Value Ref Range   Preg Test, Ur NEGATIVE NEGATIVE       EKG  EKG Interpretation None       Radiology Dg Abdomen 1 View  Result Date: 01/05/2016 CLINICAL DATA:  40 year old female status post recent lithotripsy presenting with increased left-sided abdominal pain. EXAM: ABDOMEN - 1 VIEW COMPARISON:  Abdominal radiograph dated 01/04/2016 and CT dated 01/02/2016 FINDINGS: There is a 5 mm radiopaque focus over the inferior left renal silhouette corresponding to the stone seen on the prior CT. There is faint 5 mm radiopaque structure inferior to the left L3 transverse process representing stone or cluster of small stones fragments in the proximal left ureter. This appears more distal within the ureter compared to the stone seen on the prior CT. No definite radiopaque calculi identified along the course of the left ureter distally over the pelvis. No stone is seen over the right renal silhouette or along the course of the right ureter. There is moderate stool throughout the colon. No bowel dilatation or evidence of obstruction. No free air identified. Right upper quadrant cholecystectomy clips as well as bilateral tubal ligation clips noted. The osseous structures and soft tissues are grossly unremarkable. IMPRESSION: A 5 mm stone in the inferior pole of the left kidney. A 5 mm stone or cluster of adjacent small stones within the proximal left ureter appear more distal in position compared to the left UPJ stone seen on the prior CT. Electronically Signed   By: Anner Crete M.D.   On: 01/05/2016 04:01     Ct Renal Stone Study  Result Date: 01/02/2016 CLINICAL DATA:  Left-sided flank pain, history of calculi .  The right kidney shows no evidence of hydronephrosis or renal calculi. The right ureter is within normal limits. The bladder is well distended. The left kidney demonstrates 2 small lower pole calculi measuring approximately 5 mm. Hydronephrosis is noted secondary to a proximal left ureteral stone which measures 9 mm. The more distal left ureter is within normal limits.  IMPRESSION: Nonobstructing left renal stones. Proximal left ureteral stone with obstructive changes as described. No other acute abnormality is noted. Electronically Signed   By: Inez Catalina M.D.   On: 01/02/2016 17:26   Procedures Procedures (including critical care time)  Medications Ordered in ED Medications  HYDROmorphone (DILAUDID) injection 1 mg (1 mg Intravenous Given 01/05/16 0332)  ondansetron (ZOFRAN) injection 4 mg (4 mg Intravenous Given 01/05/16 0332)  ketorolac (TORADOL) 30 MG/ML injection 30 mg (30 mg Intravenous Given 01/05/16 0412)  fentaNYL (SUBLIMAZE) injection 50 mcg (50 mcg Intravenous Given 01/05/16 0430)     Initial Impression / Assessment and Plan / ED Course  I have reviewed the  triage vital signs and the nursing notes.  Pertinent labs & imaging results that were available during my care of the patient were reviewed by me and considered in my medical decision making (see chart for details).  Clinical Course    Pt was given IV pain and nausea medications.   After reviewing her BUN and creatinine she was given toradol IV.   04:20 AM We looked at her xrays and discussed her test results. She states she is almost out of flomax and nausea meds. Also requested to get oral toradol. She still has pain at a level of "3". She was given fentanyl for pain  At discharge her pain is gone.   Final Clinical Impressions(s) / ED Diagnoses   Final diagnoses:  Ureteral colic    New Prescriptions New Prescriptions   KETOROLAC (TORADOL) 10 MG TABLET    Take 1 tablet (10 mg total) by mouth every 6 (six) hours  as needed.   ONDANSETRON (ZOFRAN ODT) 4 MG DISINTEGRATING TABLET    Take 1 tablet (4 mg total) by mouth every 8 (eight) hours as needed for nausea or vomiting.   TAMSULOSIN (FLOMAX) 0.4 MG CAPS CAPSULE    Take 1 po QD until you pass the stone.    Plan discharge  Rolland Porter, MD, Barbette Or, MD 01/05/16 [phone]

## 2016-01-05 NOTE — Discharge Instructions (Signed)
Please let Dr Ralene Muskrat office know about your ED visit tonight. Drink a lot of fluids. Take the flomax until your pain is gone. Use the zofran for the nausea or vomiting. Take the toradol orally for pain 4 times a day. Return to the ED for any fever, uncontrolled vomiting or pain.

## 2016-01-05 NOTE — ED Notes (Signed)
Pt alert & oriented x4, stable gait. Patient given discharge instructions, paperwork & prescription(s). Patient  instructed to stop at the registration desk to finish any additional paperwork. Patient verbalized understanding. Pt left department w/ no further questions. 

## 2016-01-05 NOTE — ED Triage Notes (Signed)
Pt had lithotripsy yesterday, increased pain tonight.

## 2016-01-12 DIAGNOSIS — N202 Calculus of kidney with calculus of ureter: Secondary | ICD-10-CM | POA: Diagnosis not present

## 2016-01-26 DIAGNOSIS — H6593 Unspecified nonsuppurative otitis media, bilateral: Secondary | ICD-10-CM | POA: Diagnosis not present

## 2016-01-26 DIAGNOSIS — J0100 Acute maxillary sinusitis, unspecified: Secondary | ICD-10-CM | POA: Diagnosis not present

## 2016-01-26 DIAGNOSIS — E6609 Other obesity due to excess calories: Secondary | ICD-10-CM | POA: Diagnosis not present

## 2016-01-26 DIAGNOSIS — Z1389 Encounter for screening for other disorder: Secondary | ICD-10-CM | POA: Diagnosis not present

## 2016-01-26 DIAGNOSIS — J302 Other seasonal allergic rhinitis: Secondary | ICD-10-CM | POA: Diagnosis not present

## 2016-01-26 DIAGNOSIS — Z6833 Body mass index (BMI) 33.0-33.9, adult: Secondary | ICD-10-CM | POA: Diagnosis not present

## 2016-02-23 DIAGNOSIS — R0602 Shortness of breath: Secondary | ICD-10-CM | POA: Diagnosis not present

## 2016-02-23 DIAGNOSIS — R079 Chest pain, unspecified: Secondary | ICD-10-CM | POA: Diagnosis not present

## 2016-02-24 ENCOUNTER — Other Ambulatory Visit (HOSPITAL_COMMUNITY)
Admission: RE | Admit: 2016-02-24 | Discharge: 2016-02-24 | Disposition: A | Discharge status: Home / Self Care | Payer: 59 | Source: Ambulatory Visit | Attending: Adult Health | Admitting: Adult Health

## 2016-02-24 ENCOUNTER — Encounter: Payer: Self-pay | Admitting: Adult Health

## 2016-02-24 ENCOUNTER — Ambulatory Visit (INDEPENDENT_AMBULATORY_CARE_PROVIDER_SITE_OTHER): Payer: 59 | Admitting: Adult Health

## 2016-02-24 VITALS — BP 122/80 | HR 76 | Ht 63.0 in | Wt 196.0 lb

## 2016-02-24 DIAGNOSIS — Z01419 Encounter for gynecological examination (general) (routine) without abnormal findings: Secondary | ICD-10-CM

## 2016-02-24 DIAGNOSIS — Z1211 Encounter for screening for malignant neoplasm of colon: Secondary | ICD-10-CM | POA: Diagnosis not present

## 2016-02-24 DIAGNOSIS — Z8742 Personal history of other diseases of the female genital tract: Secondary | ICD-10-CM

## 2016-02-24 DIAGNOSIS — N3010 Interstitial cystitis (chronic) without hematuria: Secondary | ICD-10-CM | POA: Diagnosis not present

## 2016-02-24 DIAGNOSIS — Z01411 Encounter for gynecological examination (general) (routine) with abnormal findings: Secondary | ICD-10-CM

## 2016-02-24 DIAGNOSIS — R87613 High grade squamous intraepithelial lesion on cytologic smear of cervix (HGSIL): Secondary | ICD-10-CM | POA: Diagnosis not present

## 2016-02-24 DIAGNOSIS — Z1151 Encounter for screening for human papillomavirus (HPV): Secondary | ICD-10-CM | POA: Insufficient documentation

## 2016-02-24 DIAGNOSIS — Z1212 Encounter for screening for malignant neoplasm of rectum: Secondary | ICD-10-CM

## 2016-02-24 LAB — HEMOCCULT GUIAC POC 1CARD (OFFICE): FECAL OCCULT BLD: NEGATIVE

## 2016-02-24 NOTE — Progress Notes (Signed)
Patient ID: Marissa Trevino, female   DOB: 10-19-1975, 41 y.o.   MRN: [id number] History of Present Illness: Marissa Trevino is a 41 year old white female in for well woman gyn exam and pap, her last pap was 10/02/14 showed ASCUS with +HPV, no visible lesions on colpo 10/22/14. PCP Dr Nevada Crane, has appt next week and labs.  Current Medications, Allergies, Past Medical History, Past Surgical History, Family History and Social History were reviewed in [employer] record.     Review of Systems: Patient denies any headaches, hearing loss, fatigue, blurred vision, shortness of breath, chest pain(had some yesterday, was treated for reflux), abdominal pain, problems with bowel movements, urination(has IC on meds), or intercourse. No joint pain or mood swings.    Physical Exam: General:  Well developed, well nourished, no acute distress Skin:  Warm and dry,and tan Neck:  Midline trachea, normal thyroid, good ROM, no lymphadenopathy Lungs; Clear to auscultation bilaterally Breast:  No dominant palpable mass, retraction, or nipple discharge Cardiovascular: Regular rate and rhythm Abdomen:  Soft, non tender, no hepatosplenomegaly Pelvic:  External genitalia is normal in appearance, no lesions.  The vagina is normal in appearance. Urethra has no lesions or masses. The cervix is bulbous.  Pap with HPV performed. Uterus is felt to be normal size, shape, and contour.  No adnexal masses or tenderness noted.Bladder is non tender, no masses felt. Rectal: Good sphincter tone, no polyps, or hemorrhoids felt.  Hemoccult negative. Extremities/musculoskeletal:  No swelling or varicosities noted, no clubbing or cyanosis Psych:  No mood changes, alert and cooperative,seems happy PHQ 2 score 0.  Impression: 1. Encounter for gynecological examination with Papanicolaou smear of cervix   2. History of abnormal cervical Pap smear   3. Screening for colorectal cancer   4. Chronic interstitial cystitis        Plan: Continue meds for IC, has refills Physical in 1 year, pap in 3 if normal Mammogram yearly Labs with PCP

## 2016-02-26 ENCOUNTER — Other Ambulatory Visit (HOSPITAL_COMMUNITY): Payer: Self-pay | Admitting: Adult Health Nurse Practitioner

## 2016-02-26 ENCOUNTER — Other Ambulatory Visit (HOSPITAL_COMMUNITY): Payer: Self-pay | Admitting: Internal Medicine

## 2016-02-26 ENCOUNTER — Ambulatory Visit (HOSPITAL_COMMUNITY)
Admission: RE | Admit: 2016-02-26 | Discharge: 2016-02-26 | Disposition: A | Discharge status: Home / Self Care | Payer: 59 | Source: Ambulatory Visit | Attending: Adult Health Nurse Practitioner | Admitting: Adult Health Nurse Practitioner

## 2016-02-26 DIAGNOSIS — R05 Cough: Secondary | ICD-10-CM | POA: Diagnosis not present

## 2016-02-26 DIAGNOSIS — R0602 Shortness of breath: Secondary | ICD-10-CM | POA: Diagnosis not present

## 2016-02-26 DIAGNOSIS — R079 Chest pain, unspecified: Secondary | ICD-10-CM | POA: Diagnosis not present

## 2016-02-29 ENCOUNTER — Telehealth: Payer: Self-pay | Admitting: Adult Health

## 2016-02-29 ENCOUNTER — Encounter: Payer: Self-pay | Admitting: Adult Health

## 2016-02-29 DIAGNOSIS — R87619 Unspecified abnormal cytological findings in specimens from cervix uteri: Secondary | ICD-10-CM

## 2016-02-29 HISTORY — DX: Unspecified abnormal cytological findings in specimens from cervix uteri: R87.619

## 2016-02-29 LAB — CYTOLOGY - PAP
ADEQUACY: ABSENT — AB
HPV: NOT DETECTED

## 2016-02-29 NOTE — Telephone Encounter (Signed)
Pt aware pap abnormal, LSIL, colpo appt made with Dr Elonda Husky

## 2016-03-08 DIAGNOSIS — I10 Essential (primary) hypertension: Secondary | ICD-10-CM | POA: Diagnosis not present

## 2016-03-08 DIAGNOSIS — Z0000 Encounter for general adult medical examination without abnormal findings: Secondary | ICD-10-CM | POA: Diagnosis not present

## 2016-03-10 DIAGNOSIS — E785 Hyperlipidemia, unspecified: Secondary | ICD-10-CM | POA: Diagnosis not present

## 2016-03-10 DIAGNOSIS — K219 Gastro-esophageal reflux disease without esophagitis: Secondary | ICD-10-CM | POA: Diagnosis not present

## 2016-03-10 DIAGNOSIS — Z0001 Encounter for general adult medical examination with abnormal findings: Secondary | ICD-10-CM | POA: Diagnosis not present

## 2016-03-17 ENCOUNTER — Ambulatory Visit (INDEPENDENT_AMBULATORY_CARE_PROVIDER_SITE_OTHER): Payer: 59 | Admitting: Obstetrics & Gynecology

## 2016-03-17 ENCOUNTER — Other Ambulatory Visit: Payer: Self-pay | Admitting: Obstetrics & Gynecology

## 2016-03-17 ENCOUNTER — Encounter: Payer: Self-pay | Admitting: Obstetrics & Gynecology

## 2016-03-17 VITALS — BP 130/80 | HR 78 | Wt 196.0 lb

## 2016-03-17 DIAGNOSIS — Z3202 Encounter for pregnancy test, result negative: Secondary | ICD-10-CM | POA: Diagnosis not present

## 2016-03-17 DIAGNOSIS — N870 Mild cervical dysplasia: Secondary | ICD-10-CM

## 2016-03-17 DIAGNOSIS — R87612 Low grade squamous intraepithelial lesion on cytologic smear of cervix (LGSIL): Secondary | ICD-10-CM | POA: Diagnosis not present

## 2016-03-17 DIAGNOSIS — R87610 Atypical squamous cells of undetermined significance on cytologic smear of cervix (ASC-US): Secondary | ICD-10-CM | POA: Diagnosis not present

## 2016-03-17 DIAGNOSIS — R87810 Cervical high risk human papillomavirus (HPV) DNA test positive: Secondary | ICD-10-CM | POA: Diagnosis not present

## 2016-03-17 LAB — POCT URINE PREGNANCY: PREG TEST UR: NEGATIVE

## 2016-03-17 NOTE — Progress Notes (Signed)
Colposcopy Procedure Note:  Colposcopy Procedure Note  Indications: Pap smear 1 months ago showed: low-grade squamous intraepithelial neoplasia (LGSIL - encompassing HPV,mild dysplasia,CIN I). The prior pap showed ASCUS with POSITIVE high risk HPV.  Prior cervical/vaginal disease: . Prior cervical treatment: .  Smoker:  No. New sexual partner:  No.  : time frame:    History of abnormal Pap: yes  Procedure Details  The risks and benefits of the procedure and Written informed consent obtained.  Speculum placed in vagina and excellent visualization of cervix achieved, cervix swabbed x 3 with acetic acid solution.  Findings: Cervix: acetowhite lesion(s) noted at 3 o'clock and punctation noted at 3 o'clock; SCJ visualized - lesion at 3 o'clock and cervical biopsies taken at 3 o'clock. Vaginal inspection: normal without visible lesions. Vulvar colposcopy: vulvar colposcopy not performed.  Specimens: cervical biopsy  Complications: none.  Plan: Specimens labelled and sent to Pathology. Will send mychart message to pt regarding biopsy

## 2016-03-17 NOTE — Addendum Note (Signed)
Addended by: Diona Fanti A on: 03/17/2016 01:08 PM   Modules accepted: Orders

## 2016-03-22 ENCOUNTER — Encounter: Payer: Self-pay | Admitting: Obstetrics & Gynecology

## 2016-04-13 DIAGNOSIS — N202 Calculus of kidney with calculus of ureter: Secondary | ICD-10-CM | POA: Diagnosis not present

## 2016-04-17 ENCOUNTER — Encounter: Payer: Self-pay | Admitting: Adult Health

## 2016-04-18 ENCOUNTER — Telehealth: Payer: Self-pay | Admitting: Adult Health

## 2016-04-18 MED ORDER — VALACYCLOVIR HCL 1 G PO TABS: 1000.0000 mg | ORAL_TABLET | Freq: Two times a day (BID) | ORAL | 3 refills | Status: DC

## 2016-04-18 NOTE — Telephone Encounter (Signed)
Pt requests refill on valtrex, will refill

## 2016-04-18 NOTE — Telephone Encounter (Signed)
Pt called stating that she would like to speak with Anderson Malta, Pt did not state the reason why please contact pt

## 2016-04-26 ENCOUNTER — Telehealth: Payer: Self-pay | Admitting: Obstetrics & Gynecology

## 2016-04-27 ENCOUNTER — Other Ambulatory Visit: Payer: Self-pay | Admitting: Obstetrics & Gynecology

## 2016-04-27 NOTE — Telephone Encounter (Signed)
LMOVM that per Dr Brynda Greathouse message there is no alternative, it is a one of a kind drug. If she had further questions to call or message Dr Elonda Husky.

## 2016-04-27 NOTE — Telephone Encounter (Signed)
There is no alternative, it is a one of a kind drug

## 2016-04-29 MED FILL — ELMIRON 100 MG CAPSULE: 100 | 30 days supply | Qty: 120 | Fill #0

## 2016-05-02 MED FILL — URO-MP CAPSULE: 118 | 10 days supply | Qty: 30 | Fill #1

## 2016-05-22 ENCOUNTER — Encounter: Payer: Self-pay | Admitting: Adult Health

## 2016-05-23 ENCOUNTER — Telehealth: Payer: Self-pay | Admitting: Adult Health

## 2016-05-23 ENCOUNTER — Other Ambulatory Visit: Payer: Self-pay | Admitting: Adult Health

## 2016-05-23 MED ORDER — VALACYCLOVIR HCL 1 G PO TABS: 1000.0000 mg | ORAL_TABLET | Freq: Every day | ORAL | 3 refills | Status: DC

## 2016-05-23 MED FILL — valACYclovir HCL 1 GM TABS: 1 | 90 days supply | Qty: 90 | Fill #0

## 2016-05-23 NOTE — Telephone Encounter (Signed)
Refilled valtrex to [facility], will take daily

## 2016-05-23 NOTE — Telephone Encounter (Signed)
Left message I Called  

## 2016-06-03 MED FILL — ELMIRON 100 MG CAPSULE: 100 | 30 days supply | Qty: 120 | Fill #1

## 2016-07-07 MED FILL — ELMIRON 100 MG CAPSULE: 100 | 30 days supply | Qty: 120 | Fill #2

## 2016-08-01 IMAGING — CR DG ABDOMEN 1V
1 series · 1 of 1 positions shown · non-contrast
Comparison: 10/06/2014

CLINICAL DATA: Left ureteral stone.

EXAM:
ABDOMEN - 1 VIEW

[t abdomen supine]
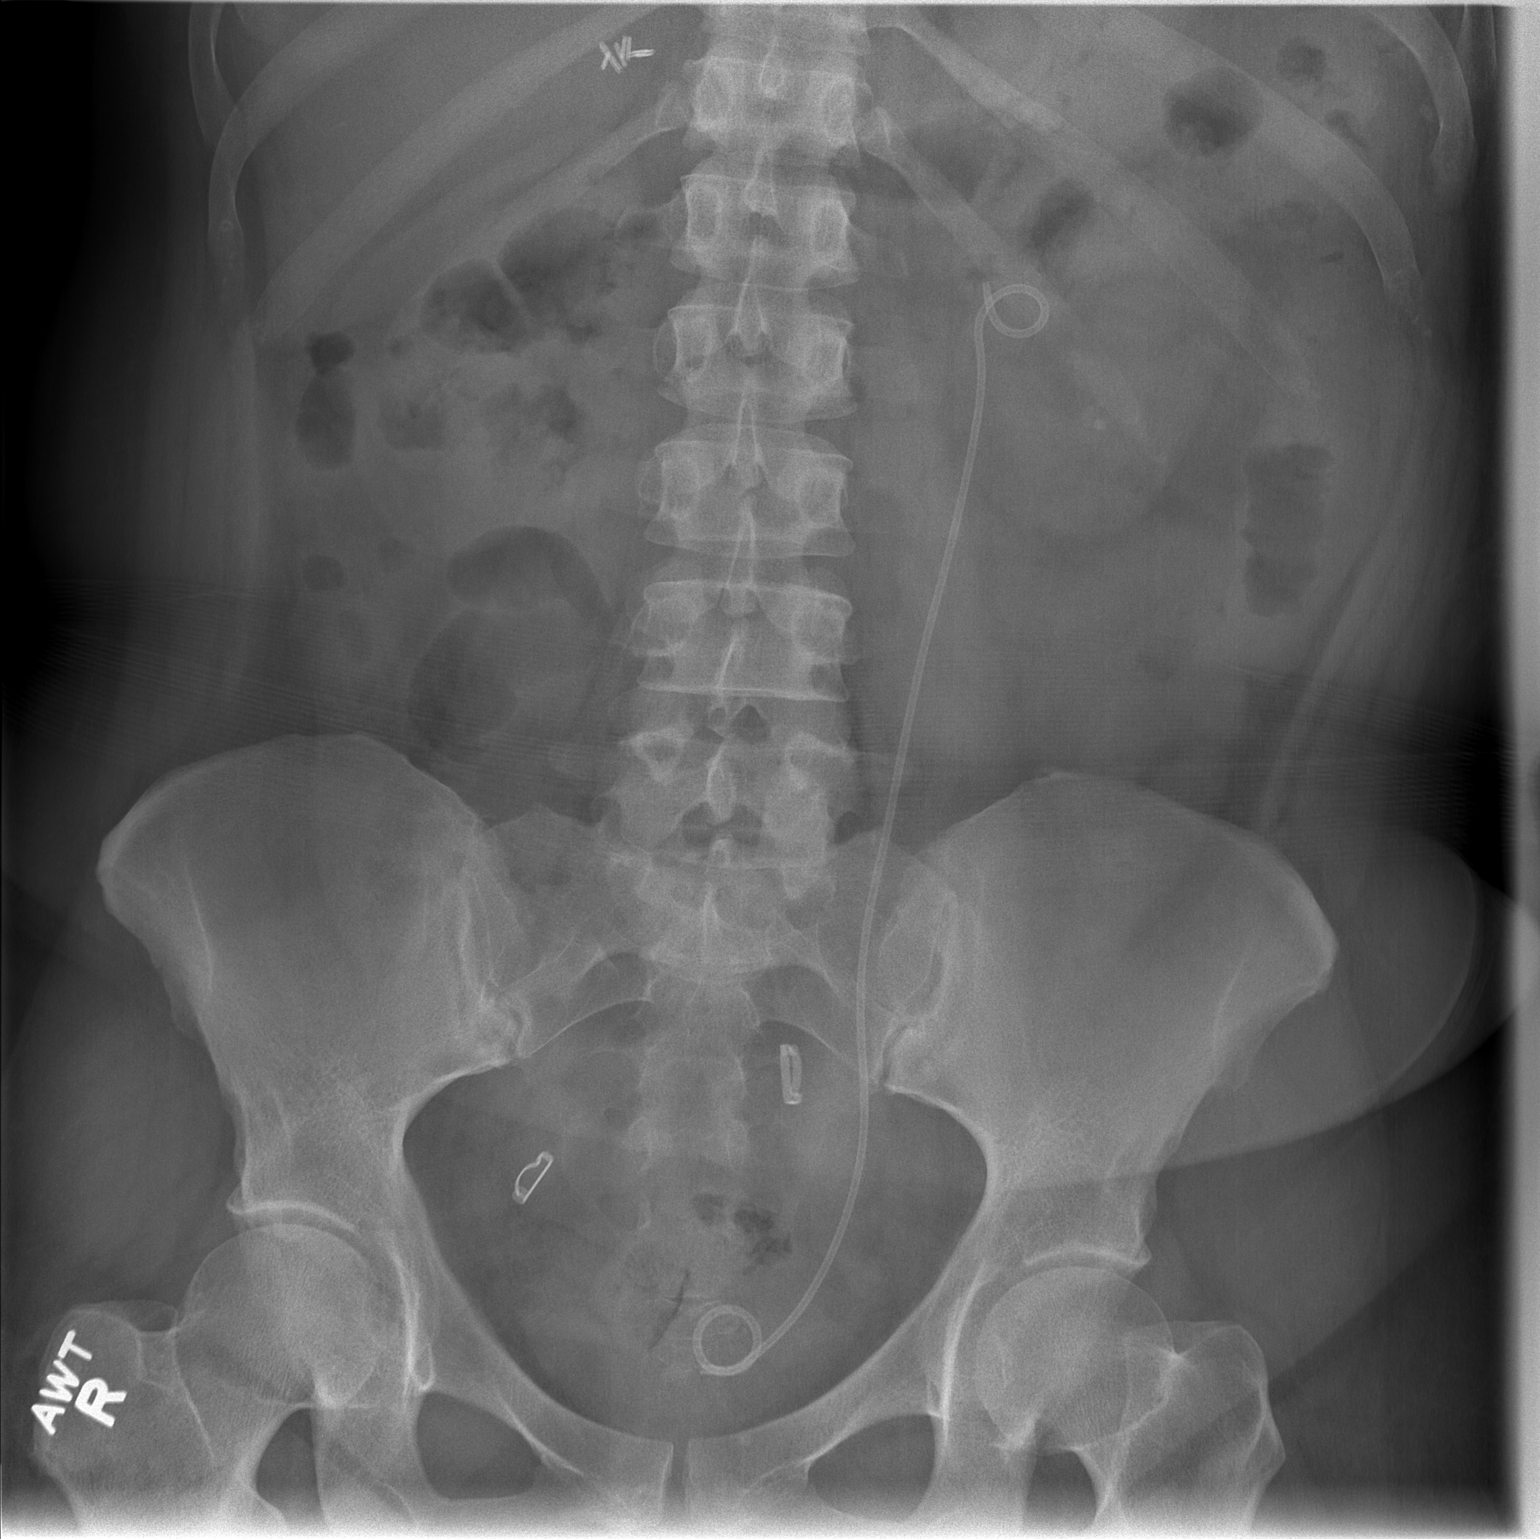

[1 of 1 positions shown; findings below may reference images not displayed]

FINDINGS: The bowel gas pattern is normal. Calcific densities are seen
overlying the inferior left renal shadow. However the previously
seen calcific density overlying the expected location of mid left
ureter is no longer visualized. Double-J left ureteral stent is in
place. Metallic clips are seen within the pelvis, probable prior
fallopian tube closure. Cholecystectomy clips are noted. Osseous
structures are normal.
IMPRESSION: Nonvisualization of previously seen left mid ureteral calculus.

Left lower pole renal calculi.

## 2016-08-02 DIAGNOSIS — J019 Acute sinusitis, unspecified: Secondary | ICD-10-CM | POA: Diagnosis not present

## 2016-08-11 MED FILL — ELMIRON 100 MG CAPSULE: 100 | 30 days supply | Qty: 120 | Fill #3

## 2016-09-15 DIAGNOSIS — N202 Calculus of kidney with calculus of ureter: Secondary | ICD-10-CM | POA: Diagnosis not present

## 2016-09-15 DIAGNOSIS — N3010 Interstitial cystitis (chronic) without hematuria: Secondary | ICD-10-CM | POA: Diagnosis not present

## 2016-09-22 DIAGNOSIS — Z6835 Body mass index (BMI) 35.0-35.9, adult: Secondary | ICD-10-CM | POA: Diagnosis not present

## 2016-09-22 DIAGNOSIS — J019 Acute sinusitis, unspecified: Secondary | ICD-10-CM | POA: Diagnosis not present

## 2016-09-22 DIAGNOSIS — R05 Cough: Secondary | ICD-10-CM | POA: Diagnosis not present

## 2016-09-27 MED FILL — ELMIRON 100 MG CAPSULE: 100 | 30 days supply | Qty: 120 | Fill #4

## 2016-10-07 DIAGNOSIS — H6503 Acute serous otitis media, bilateral: Secondary | ICD-10-CM | POA: Diagnosis not present

## 2016-10-07 DIAGNOSIS — Z6834 Body mass index (BMI) 34.0-34.9, adult: Secondary | ICD-10-CM | POA: Diagnosis not present

## 2016-10-22 ENCOUNTER — Emergency Department (HOSPITAL_COMMUNITY)
Admission: EM | Admit: 2016-10-22 | Discharge: 2016-10-22 | Disposition: A | Discharge status: Home / Self Care | Payer: 59 | Attending: Emergency Medicine | Admitting: Emergency Medicine

## 2016-10-22 ENCOUNTER — Encounter (HOSPITAL_COMMUNITY): Payer: Self-pay | Admitting: Emergency Medicine

## 2016-10-22 ENCOUNTER — Emergency Department (HOSPITAL_COMMUNITY): Payer: 59

## 2016-10-22 DIAGNOSIS — R002 Palpitations: Secondary | ICD-10-CM | POA: Diagnosis not present

## 2016-10-22 DIAGNOSIS — Z79899 Other long term (current) drug therapy: Secondary | ICD-10-CM | POA: Diagnosis not present

## 2016-10-22 DIAGNOSIS — R079 Chest pain, unspecified: Secondary | ICD-10-CM | POA: Diagnosis not present

## 2016-10-22 DIAGNOSIS — I493 Ventricular premature depolarization: Secondary | ICD-10-CM

## 2016-10-22 DIAGNOSIS — R0602 Shortness of breath: Secondary | ICD-10-CM | POA: Diagnosis not present

## 2016-10-22 LAB — CBC
HCT: 39.6 % (ref 36.0–46.0)
HEMOGLOBIN: 13.6 g/dL (ref 12.0–15.0)
MCH: 30.8 pg (ref 26.0–34.0)
MCHC: 34.3 g/dL (ref 30.0–36.0)
MCV: 89.6 fL (ref 78.0–100.0)
Platelets: 257 10*3/uL (ref 150–400)
RBC: 4.42 MIL/uL (ref 3.87–5.11)
RDW: 12.2 % (ref 11.5–15.5)
WBC: 9.2 10*3/uL (ref 4.0–10.5)

## 2016-10-22 LAB — BASIC METABOLIC PANEL
ANION GAP: 8 (ref 5–15)
BUN: 11 mg/dL (ref 6–20)
CHLORIDE: 105 mmol/L (ref 101–111)
CO2: 27 mmol/L (ref 22–32)
CREATININE: 0.63 mg/dL (ref 0.44–1.00)
Calcium: 9.6 mg/dL (ref 8.9–10.3)
GFR calc non Af Amer: >60 mL/min (ref >60)
Glucose, Bld: 119 mg/dL — ABNORMAL HIGH (ref 65–99)
Potassium: 3.3 mmol/L — ABNORMAL LOW (ref 3.5–5.1)
Sodium: 140 mmol/L (ref 135–145)

## 2016-10-22 NOTE — ED Triage Notes (Signed)
having heart palpitations upon waking this am. Rates discomfort 5/10.  Having some SOB just with chest pain.

## 2016-10-22 NOTE — ED Provider Notes (Signed)
[facility] DEPT Provider Note   CSN: [id number] Arrival date & time: 10/22/16  1708     History   Chief Complaint Chief Complaint  Patient presents with  . Tachycardia    HPI Marissa Trevino is a 41 y.o. female.  HPI  The patient is a 41 year old female who reports a history of injury cystoscopy cystitis, no other significant chronic medical problems. She reports that she was in her usual state of health, this morning she noticed that she was having more frequent palpitations in her chest, sometimes it would last for a couple of minutes and sometimes it was just a very singular event, these are associated with some shortness of breath. She denies any new over-the-counter medications, she does take an antiallergy medication chronically. She has not recently taken any over-the-counter medications, denies any stimulants, denies alcohol  or substance abuse, no unexpected weight loss. At this time she does not have any symptoms. Past Medical History:  Diagnosis Date  . Abnormal Pap smear of cervix 02/29/2016   LSIL, well get colpo  . Abnormal uterine bleeding (AUB) 09/15/2014  . Acid reflux   . Arthritis   . BV (bacterial vaginosis) 05/02/2012  . Cancer ([place])    skin  . Chronic kidney disease    kidney stones  . Fibroadenoma of left breast 11/03/2014   Had bx 10/28/14  . Hematuria 12/18/2012  . Herpes simplex without mention of complication   . History of kidney stones 11/14/2014  . IC (interstitial cystitis) 02/23/2015  . Interstitial cystitis   . Pelvic pain in female 09/15/2014  . PONV (postoperative nausea and vomiting)   . Trichimoniasis 12/10/2014  . Urinary frequency 11/14/2014  . UTI (lower urinary tract infection) 12/18/2012  . Vaginal discharge 12/10/2014  . Vaginal irritation 12/10/2014    Patient Active Problem List   Diagnosis Date Noted  . Abnormal Pap smear of cervix 02/29/2016  . History of abnormal cervical Pap smear 02/24/2016  . Encounter for  gynecological examination with Papanicolaou smear of cervix 02/24/2016  . IC (interstitial cystitis) 02/23/2015  . Vaginal irritation 12/10/2014  . Vaginal discharge 12/10/2014  . Trichimoniasis 12/10/2014  . Urinary frequency 11/14/2014  . History of kidney stones 11/14/2014  . Fibroadenoma of left breast 11/03/2014  . Renal colic on left side 13/08/6576  . Pelvic pain in female 09/15/2014  . Abnormal uterine bleeding (AUB) 09/15/2014  . Chronic interstitial cystitis 01/31/2013  . Hematuria 12/18/2012  . UTI (lower urinary tract infection) 12/18/2012  . Retroversion, uterus 05/24/2012  . BV (bacterial vaginosis) 05/02/2012  . DERMATITIS, [place] 07/09/2008  . CANDIDIASIS, VAGINAL 05/30/2008  . DYSURIA 05/30/2008  . BRONCHITIS, ACUTE WITH BRONCHOSPASM 10/10/2007  . SCIATICA 02/01/2007  . GERD 02/27/2006  . Constipation 02/27/2006  . SKIN LESION 02/27/2006    Past Surgical History:  Procedure Laterality Date  . CESAREAN SECTION    . CHOLECYSTECTOMY  16 years ago   [facility]  . CYSTOSCOPY WITH RETROGRADE PYELOGRAM, URETEROSCOPY AND STENT PLACEMENT Left 10/07/2014   Procedure: CYSTOSCOPY WITH LEFT RETROGRADE PYELOGRAM, URETEROSCOPY AND LEFT STENT PLACEMENT;  Surgeon: Cleon Gustin, MD;  Location: [facility];  Service: Urology;  Laterality: Left;  . DILITATION & CURRETTAGE/HYSTROSCOPY WITH THERMACHOICE ABLATION N/A 03/09/2012   Procedure: DILATATION & CURETTAGE/HYSTEROSCOPY WITH THERMACHOICE ABLATION;  Surgeon: Florian Buff, MD;  Location: [facility];  Service: Gynecology;  Laterality: N/A;  D5W in -29ml,out 55ml, Total therapy time:40min 39sec   . LITHOTRIPSY  10/09/14  . LITHOTRIPSY  12/2015  .  TONSILLECTOMY AND ADENOIDECTOMY    . TUBAL LIGATION      OB History    Gravida Para Term Preterm AB Living   3 3       3    SAB TAB Ectopic Multiple Live Births           3       Home Medications    Prior to Admission medications   Medication Sig Start Date End Date Taking?  Authorizing Provider  BIOTIN PO Take 1 capsule by mouth daily as needed.   Yes [provider]  cetirizine (ZYRTEC) 10 MG tablet Take 10 mg by mouth daily as needed for allergies.   Yes [provider]  ELMIRON 100 MG capsule TAKE 2 CAPSULES BY MOUTH TWICE DAILY 04/29/16  Yes Florian Buff, MD  Meth-Hyo-M Barnett Hatter Phos-Ph Sal (URIBEL) 118 MG CAPS Take 118 mg by mouth 3 (three) times daily as needed (for urinary frequency).   Yes [provider]  mirabegron ER (MYRBETRIQ) 50 MG TB24 tablet Take 50 mg by mouth daily as needed (for urinary frequency).   Yes [provider]  cefUROXime (CEFTIN) 500 MG tablet Take 500 mg by mouth 2 (two) times daily. 10/07/16   [provider]    Family History Family History  Problem Relation Age of Onset  . Hypertension Mother   . Heart disease Mother   . Hypertension Father   . Heart disease Father   . Heart attack Father   . Lung cancer Father   . Cancer Father   . Cancer Maternal Grandmother        breast  . Diabetes Paternal Grandmother   . Cancer Paternal Grandfather        lung  . Heart attack Brother   . Hypertension Brother   . Hyperlipidemia Brother   . Other Brother        brain tumor  . Colon cancer Neg Hx     Social History Social History  Substance Use Topics  . Smoking status: Never Smoker  . Smokeless tobacco: Never Used  . Alcohol use No     Allergies   Codeine   Review of Systems Review of Systems  All other systems reviewed and are negative.    Physical Exam Updated Vital Signs BP 133/79   Pulse 83   Temp 97.7 F (36.5 C) (Oral)   Resp 19   Ht 5\' 3"  (1.6 m)   Wt 88.5 kg (195 lb)   LMP 10/07/2016 (Exact Date)   SpO2 100%   BMI 34.54 kg/m   Physical Exam  Constitutional: She appears well-developed and well-nourished. No distress.  HENT:  Head: Normocephalic and atraumatic.  Mouth/Throat: Oropharynx is clear and moist. No oropharyngeal exudate.  Eyes: Pupils are  equal, round, and reactive to light. Conjunctivae and EOM are normal. Right eye exhibits no discharge. Left eye exhibits no discharge. No scleral icterus.  Neck: Normal range of motion. Neck supple. No JVD present. No thyromegaly present.  Cardiovascular: Normal rate, regular rhythm, normal heart sounds and intact distal pulses.  Exam reveals no gallop and no friction rub.   No murmur heard. Occasional PVC on the monitor - normal heart sounds  Pulmonary/Chest: Effort normal and breath sounds normal. No respiratory distress. She has no wheezes. She has no rales.  Abdominal: Soft. Bowel sounds are normal. She exhibits no distension and no mass. There is no tenderness.  Musculoskeletal: Normal range of motion. She exhibits no edema or tenderness.  Lymphadenopathy:    She has no cervical adenopathy.  Neurological: She is alert. Coordination normal.  Skin: Skin is warm and dry. No rash noted. No erythema.  Psychiatric: She has a normal mood and affect. Her behavior is normal.  Nursing note and vitals reviewed.    ED Treatments / Results  Labs (all labs ordered are listed, but only abnormal results are displayed) Labs Reviewed  BASIC METABOLIC PANEL - Abnormal; Notable for the following:       Result Value   Potassium 3.3 (*)    Glucose, Bld 119 (*)    All other components within normal limits  CBC    EKG  EKG Interpretation  Date/Time:  Saturday October 22 2016 17:27:41 EDT Ventricular Rate:  88 PR Interval:    QRS Duration: 109 QT Interval:  393 QTC Calculation: 476 R Axis:   22 Text Interpretation:  Sinus rhythm Normal ECG No old tracing to compare Confirmed by Noemi Chapel [phone]) on 10/22/2016 6:45:51 PM       Radiology Dg Chest 2 View  Result Date: 10/22/2016 CLINICAL DATA:  Chest pain and shortness of breath.  Palpitations. EXAM: CHEST  2 VIEW COMPARISON:  02/26/2016. FINDINGS: Normal sized heart. Clear lungs with normal vascularity. Minimal thoracic spine  degenerative changes. Cholecystectomy clips. IMPRESSION: No acute abnormality. Electronically Signed   By: Claudie Revering M.D.   On: 10/22/2016 18:52    Procedures Procedures (including critical care time)  Medications Ordered in ED Medications - No data to display   Initial Impression / Assessment and Plan / ED Course  I have reviewed the triage vital signs and the nursing notes.  Pertinent labs & imaging results that were available during my care of the patient were reviewed by me and considered in my medical decision making (see chart for details).    The patient reports having a heart rate as high as 120 this morning, at this time she is in a sinus rhythm with a rate of about 80 with the occasional PVC which she correlates with her symptoms.  Labs look great - mild hypo K, dietary instructions given CxR normal Stable for d/c Needs outpt holter if continues Pt agreeable  Final Clinical Impressions(s) / ED Diagnoses   Final diagnoses:  Palpitations  PVC (premature ventricular contraction)    New Prescriptions New Prescriptions   No medications on file     Noemi Chapel, MD 10/22/16 1904

## 2016-10-24 DIAGNOSIS — Z712 Person consulting for explanation of examination or test findings: Secondary | ICD-10-CM | POA: Diagnosis not present

## 2016-10-24 DIAGNOSIS — Z09 Encounter for follow-up examination after completed treatment for conditions other than malignant neoplasm: Secondary | ICD-10-CM | POA: Diagnosis not present

## 2016-10-24 DIAGNOSIS — R002 Palpitations: Secondary | ICD-10-CM | POA: Diagnosis not present

## 2016-11-07 DIAGNOSIS — R002 Palpitations: Secondary | ICD-10-CM | POA: Diagnosis not present

## 2016-11-07 DIAGNOSIS — Z6834 Body mass index (BMI) 34.0-34.9, adult: Secondary | ICD-10-CM | POA: Diagnosis not present

## 2016-11-09 ENCOUNTER — Other Ambulatory Visit: Payer: Self-pay | Admitting: Adult Health

## 2016-11-09 DIAGNOSIS — Z1231 Encounter for screening mammogram for malignant neoplasm of breast: Secondary | ICD-10-CM

## 2016-11-10 ENCOUNTER — Ambulatory Visit (HOSPITAL_COMMUNITY)
Admission: RE | Admit: 2016-11-10 | Discharge: 2016-11-10 | Disposition: A | Discharge status: Home / Self Care | Payer: 59 | Source: Ambulatory Visit | Attending: Adult Health | Admitting: Adult Health

## 2016-11-10 DIAGNOSIS — Z1231 Encounter for screening mammogram for malignant neoplasm of breast: Secondary | ICD-10-CM | POA: Diagnosis not present

## 2016-11-10 MED FILL — ELMIRON 100 MG CAPSULE: 100 | 30 days supply | Qty: 120 | Fill #5

## 2016-11-14 ENCOUNTER — Ambulatory Visit (HOSPITAL_COMMUNITY)
Admission: RE | Admit: 2016-11-14 | Discharge: 2016-11-14 | Disposition: A | Discharge status: Home / Self Care | Payer: 59 | Source: Ambulatory Visit | Attending: Cardiovascular Disease | Admitting: Cardiovascular Disease

## 2016-11-14 ENCOUNTER — Ambulatory Visit (INDEPENDENT_AMBULATORY_CARE_PROVIDER_SITE_OTHER): Payer: 59 | Admitting: Otolaryngology

## 2016-11-14 ENCOUNTER — Encounter: Payer: Self-pay | Admitting: Cardiovascular Disease

## 2016-11-14 ENCOUNTER — Other Ambulatory Visit (HOSPITAL_COMMUNITY)
Admission: RE | Admit: 2016-11-14 | Discharge: 2016-11-14 | Disposition: A | Discharge status: Home / Self Care | Payer: 59 | Source: Ambulatory Visit | Attending: Cardiovascular Disease | Admitting: Cardiovascular Disease

## 2016-11-14 ENCOUNTER — Ambulatory Visit (INDEPENDENT_AMBULATORY_CARE_PROVIDER_SITE_OTHER): Payer: 59 | Admitting: Cardiovascular Disease

## 2016-11-14 VITALS — BP 140/100 | HR 76 | Ht 63.0 in | Wt 198.0 lb

## 2016-11-14 DIAGNOSIS — E876 Hypokalemia: Secondary | ICD-10-CM

## 2016-11-14 DIAGNOSIS — H6983 Other specified disorders of Eustachian tube, bilateral: Secondary | ICD-10-CM

## 2016-11-14 DIAGNOSIS — H903 Sensorineural hearing loss, bilateral: Secondary | ICD-10-CM | POA: Diagnosis not present

## 2016-11-14 DIAGNOSIS — K219 Gastro-esophageal reflux disease without esophagitis: Secondary | ICD-10-CM | POA: Insufficient documentation

## 2016-11-14 DIAGNOSIS — R0602 Shortness of breath: Secondary | ICD-10-CM | POA: Diagnosis not present

## 2016-11-14 DIAGNOSIS — R030 Elevated blood-pressure reading, without diagnosis of hypertension: Secondary | ICD-10-CM

## 2016-11-14 DIAGNOSIS — J40 Bronchitis, not specified as acute or chronic: Secondary | ICD-10-CM | POA: Diagnosis not present

## 2016-11-14 DIAGNOSIS — R002 Palpitations: Secondary | ICD-10-CM | POA: Insufficient documentation

## 2016-11-14 DIAGNOSIS — I493 Ventricular premature depolarization: Secondary | ICD-10-CM | POA: Diagnosis not present

## 2016-11-14 DIAGNOSIS — Z9289 Personal history of other medical treatment: Secondary | ICD-10-CM | POA: Diagnosis not present

## 2016-11-14 DIAGNOSIS — I491 Atrial premature depolarization: Secondary | ICD-10-CM | POA: Diagnosis not present

## 2016-11-14 LAB — MAGNESIUM: Magnesium: 1.9 mg/dL (ref 1.7–2.4)

## 2016-11-14 LAB — ECHOCARDIOGRAM COMPLETE
HEIGHTINCHES: 63 in
WEIGHTICAEL: 3168 [oz_av]

## 2016-11-14 LAB — POTASSIUM: POTASSIUM: 3.5 mmol/L (ref 3.5–5.1)

## 2016-11-14 NOTE — Progress Notes (Signed)
CARDIOLOGY CONSULT NOTE  Patient ID: Marissa Trevino MRN: [id number] DOB/AGE: 08/16/1975 41 y.o.  Admit date: (Not on file) Primary Physician: Celene Squibb, MD Referring Physician: Pablo Lawrence NP  Reason for Consultation: Palpitations  HPI: Marissa Trevino is a 41 y.o. female who is being seen today for the evaluation of palpitations at the request of Pablo Lawrence NP.  I have personally reviewed all documentation, labs, radiographic and cardiovascular studies, and independently interpreted all ECG's.  She was recently evaluated in the office by her nurse practitioner. Propranolol dose was adjusted.  Labs 10/22/16: Potassium low at 3.3, sodium 140, BUN 11, creatinine 0.63, white blood cells 9.2, hemoglobin 13.6, platelets 257.  ECG performed on 10/24/16 which I personally interpreted demonstrated sinus rhythm, 88 bpm, with no ischemic ST segment or T-wave abnormalities, nor any arrhythmias.  Her blood pressure is usually normal when checked at her PCPs office. It is elevated at 140/100 today.  She has been experiencing palpitations for the past 3 weeks. She was evaluated in the ED on 10/22/16. Cardiac monitoring reportedly demonstrated PVCs.  She was initially started on propranolol 20 mg and this was increased to 40 mg. Since that time she has noticed some diminishment in symptoms.  She denies exertional chest pain. She has shortness of breath when palpitations or sustained. She also expresses shortness of breath at times when she is talking and when bending over to tie her shoes.  She denies leg swelling, orthopnea, and paroxysmal nocturnal dyspnea.  She does not drink coffee. She only eats chocolate at the time of menses. She drinks one caffeinated beverage a day.  Social history: She works at the [employer]. She does not smoke and does not drink alcohol.   Allergies  Allergen Reactions  . Codeine Nausea And Vomiting    Current Outpatient Prescriptions    Medication Sig Dispense Refill  . ALPRAZolam (XANAX) 0.25 MG tablet Take 0.25 mg by mouth daily as needed.  0  . BIOTIN PO Take 1 capsule by mouth daily as needed.    . cetirizine (ZYRTEC) 10 MG tablet Take 10 mg by mouth daily as needed for allergies.    Marland Kitchen ELMIRON 100 MG capsule TAKE 2 CAPSULES BY MOUTH TWICE DAILY 120 capsule 11  . Meth-Hyo-M Bl-Na Phos-Ph Sal (URIBEL) 118 MG CAPS Take 118 mg by mouth 3 (three) times daily as needed (for urinary frequency).    . mirabegron ER (MYRBETRIQ) 50 MG TB24 tablet Take 50 mg by mouth daily as needed (for urinary frequency).    . propranolol (INDERAL) 40 MG tablet TAKE 1 TABLET BY MOUTH EVERY DAY AT NIGHT  0   No current facility-administered medications for this visit.     Past Medical History:  Diagnosis Date  . Abnormal Pap smear of cervix 02/29/2016   LSIL, well get colpo  . Abnormal uterine bleeding (AUB) 09/15/2014  . Acid reflux   . Arthritis   . BV (bacterial vaginosis) 05/02/2012  . Cancer ([place])    skin  . Chronic kidney disease    kidney stones  . Fibroadenoma of left breast 11/03/2014   Had bx 10/28/14  . Hematuria 12/18/2012  . Herpes simplex without mention of complication   . History of kidney stones 11/14/2014  . IC (interstitial cystitis) 02/23/2015  . Interstitial cystitis   . Pelvic pain in female 09/15/2014  . PONV (postoperative nausea and vomiting)   . Trichimoniasis 12/10/2014  . Urinary frequency 11/14/2014  .  UTI (lower urinary tract infection) 12/18/2012  . Vaginal discharge 12/10/2014  . Vaginal irritation 12/10/2014    Past Surgical History:  Procedure Laterality Date  . CESAREAN SECTION    . CHOLECYSTECTOMY  16 years ago   [facility]  . CYSTOSCOPY WITH RETROGRADE PYELOGRAM, URETEROSCOPY AND STENT PLACEMENT Left 10/07/2014   Procedure: CYSTOSCOPY WITH LEFT RETROGRADE PYELOGRAM, URETEROSCOPY AND LEFT STENT PLACEMENT;  Surgeon: Cleon Gustin, MD;  Location: [facility];  Service: Urology;  Laterality: Left;  .  DILITATION & CURRETTAGE/HYSTROSCOPY WITH THERMACHOICE ABLATION N/A 03/09/2012   Procedure: DILATATION & CURETTAGE/HYSTEROSCOPY WITH THERMACHOICE ABLATION;  Surgeon: Florian Buff, MD;  Location: [facility];  Service: Gynecology;  Laterality: N/A;  D5W in -41ml,out 46ml, Total therapy time:16min 39sec   . LITHOTRIPSY  10/09/14  . LITHOTRIPSY  12/2015  . TONSILLECTOMY AND ADENOIDECTOMY    . TUBAL LIGATION      Social History   Social History  . Marital status: Single    Spouse name: N/A  . Number of children: N/A  . Years of education: N/A   Occupational History  . nurse tech     the [employer]   Social History Main Topics  . Smoking status: Never Smoker  . Smokeless tobacco: Never Used  . Alcohol use No  . Drug use: No  . Sexual activity: Yes    Birth control/ protection: Surgical     Comment: tubal   Other Topics Concern  . Not on file   Social History Narrative  . No narrative on file     No family history of premature CAD in 1st degree relatives.  Current Meds  Medication Sig  . ALPRAZolam (XANAX) 0.25 MG tablet Take 0.25 mg by mouth daily as needed.  Marland Kitchen BIOTIN PO Take 1 capsule by mouth daily as needed.  . cetirizine (ZYRTEC) 10 MG tablet Take 10 mg by mouth daily as needed for allergies.  Marland Kitchen ELMIRON 100 MG capsule TAKE 2 CAPSULES BY MOUTH TWICE DAILY  . Meth-Hyo-M Bl-Na Phos-Ph Sal (URIBEL) 118 MG CAPS Take 118 mg by mouth 3 (three) times daily as needed (for urinary frequency).  . mirabegron ER (MYRBETRIQ) 50 MG TB24 tablet Take 50 mg by mouth daily as needed (for urinary frequency).  . propranolol (INDERAL) 40 MG tablet TAKE 1 TABLET BY MOUTH EVERY DAY AT NIGHT      Review of systems complete and found to be negative unless listed above in HPI    Physical exam Blood pressure (!) 140/100, pulse 76, height 5\' 3"  (1.6 m), weight 198 lb (89.8 kg), last menstrual period 11/03/2016, SpO2 96 %. General: NAD Neck: No JVD, no thyromegaly or thyroid nodule.  Lungs:  Clear to auscultation bilaterally with normal respiratory effort. CV: Nondisplaced PMI. Regular rate and rhythm, normal S1/S2, no S3/S4, no murmur.  No peripheral edema.  No carotid bruit.    Abdomen: Soft, nontender, no distention.  Skin: Intact without lesions or rashes.  Neurologic: Alert and oriented x 3.  Psych: Normal affect. Extremities: No clubbing or cyanosis.  HEENT: Normal.   ECG: Most recent ECG reviewed.   Labs: Lab Results  Component Value Date/Time   K 3.3 (L) 10/22/2016 06:19 PM   BUN 11 10/22/2016 06:19 PM   CREATININE 0.63 10/22/2016 06:19 PM   ALT 15 10/04/2014 04:39 AM   HGB 13.6 10/22/2016 06:19 PM     Lipids: Lab Results  Component Value Date/Time   LDLCALC 96 05/30/2008 11:23 PM   CHOL 166  05/30/2008 11:23 PM   TRIG 72 05/30/2008 11:23 PM   HDL 56 05/30/2008 11:23 PM        ASSESSMENT AND PLAN:  1. Palpitations: I will obtain a 48 hour Holter monitor to assess for symptomatic PVCs. I will also check potassium and magnesium levels. She does not drink a significant amount of caffeine. I will order a 2-D echocardiogram with Doppler to evaluate cardiac structure, function, and regional wall motion. Continue propranolol 40 mg daily for now.  2. Shortness of breath: She is not a smoker. This can occur even with talking and when bending over to tie her shoes. I will order a 2-D echocardiogram with Doppler to evaluate cardiac structure, function, and regional wall motion.  3. Elevated blood pressure: She does not carry a diagnosis of hypertension. I will assess cardiac structure and function with an echocardiogram to see if there is any evidence of LVH with diastolic dysfunction. I will monitor her blood pressures.  4. Hypokalemia: I will check a basic metabolic panel.    Disposition: Follow up in 1 month  Signed: Kate Sable, M.D., F.A.C.C.  11/14/2016, 2:29 PM

## 2016-11-14 NOTE — Patient Instructions (Signed)
Your physician recommends that you schedule a follow-up appointment in: 1 month with [facility]    Your physician has requested that you have an echocardiogram. Echocardiography is a painless test that uses sound waves to create images of your heart. It provides your doctor with information about the size and shape of your heart and how well your heart's chambers and valves are working. This procedure takes approximately one hour. There are no restrictions for this procedure.     Your physician has recommended that you wear a holter monitor. Holter monitors are medical devices that record the heart's electrical activity. Doctors most often use these monitors to diagnose arrhythmias. Arrhythmias are problems with the speed or rhythm of the heartbeat. The monitor is a small, portable device. You can wear one while you do your normal daily activities. This is usually used to diagnose what is causing palpitations/syncope (passing out).     Your physician recommends that you return for lab work in: magnesium,potassium         Thank you for choosing [facility] !

## 2016-11-15 ENCOUNTER — Telehealth: Payer: Self-pay

## 2016-11-15 DIAGNOSIS — E876 Hypokalemia: Secondary | ICD-10-CM

## 2016-11-15 MED ORDER — POTASSIUM CHLORIDE CRYS ER 20 MEQ PO TBCR: 20.0000 meq | EXTENDED_RELEASE_TABLET | Freq: Every day | ORAL | 3 refills | Status: DC

## 2016-11-15 MED FILL — POTASSIUM CL ER 20 MEQ TABL: 20 | 90 days supply | Qty: 90 | Fill #0

## 2016-11-15 NOTE — Telephone Encounter (Signed)
-----   Message from Herminio Commons, MD sent at 11/15/2016  8:49 AM EDT ----- K low normal. Start KCl 20 meq daily and repeat BMET in one week.

## 2016-11-15 NOTE — Telephone Encounter (Signed)
Pt notified of lab results, e-scribed K+ to [facility], lab slip at front desk

## 2016-11-23 DIAGNOSIS — H5213 Myopia, bilateral: Secondary | ICD-10-CM | POA: Diagnosis not present

## 2016-11-25 ENCOUNTER — Ambulatory Visit: Payer: 59 | Admitting: Cardiovascular Disease

## 2016-12-22 ENCOUNTER — Encounter: Payer: Self-pay | Admitting: Cardiovascular Disease

## 2016-12-22 ENCOUNTER — Ambulatory Visit (INDEPENDENT_AMBULATORY_CARE_PROVIDER_SITE_OTHER): Payer: 59 | Admitting: Cardiovascular Disease

## 2016-12-22 VITALS — BP 136/84 | HR 82 | Ht 63.0 in | Wt 199.0 lb

## 2016-12-22 DIAGNOSIS — R030 Elevated blood-pressure reading, without diagnosis of hypertension: Secondary | ICD-10-CM | POA: Diagnosis not present

## 2016-12-22 DIAGNOSIS — R002 Palpitations: Secondary | ICD-10-CM | POA: Diagnosis not present

## 2016-12-22 DIAGNOSIS — R0602 Shortness of breath: Secondary | ICD-10-CM

## 2016-12-22 DIAGNOSIS — E876 Hypokalemia: Secondary | ICD-10-CM | POA: Diagnosis not present

## 2016-12-22 MED ORDER — PROPRANOLOL HCL 40 MG PO TABS: ORAL_TABLET | ORAL | 4 refills | Status: DC

## 2016-12-22 NOTE — Progress Notes (Signed)
SUBJECTIVE: The patient presents for follow-up of palpitations.  She underwent a normal echocardiogram. Holter monitoring demonstrated sinus rhythm with occasional PACs and PVCs. Potassium was low at 3.3 for which I prescribed supplementation. Mg was normal at 1.9.  She is feeling better. She says she stays stressed from kids and work. She also doesn't get enough rest.  She forgot to get her potassium rechecked.   Social history: She works at the [employer]. She does not smoke and does not drink alcohol.   Review of Systems: As per "subjective", otherwise negative.  Allergies  Allergen Reactions  . Codeine Nausea And Vomiting    Current Outpatient Medications  Medication Sig Dispense Refill  . ALPRAZolam (XANAX) 0.25 MG tablet Take 0.25 mg by mouth daily as needed.  0  . BIOTIN PO Take 1 capsule by mouth daily as needed.    . cetirizine (ZYRTEC) 10 MG tablet Take 10 mg by mouth daily as needed for allergies.    Marland Kitchen ELMIRON 100 MG capsule TAKE 2 CAPSULES BY MOUTH TWICE DAILY 120 capsule 11  . Meth-Hyo-M Bl-Na Phos-Ph Sal (URIBEL) 118 MG CAPS Take 118 mg by mouth 3 (three) times daily as needed (for urinary frequency).    . mirabegron ER (MYRBETRIQ) 50 MG TB24 tablet Take 50 mg by mouth daily as needed (for urinary frequency).    . potassium chloride SA (KLOR-CON M20) 20 MEQ tablet Take 1 tablet (20 mEq total) by mouth daily. 90 tablet 3  . propranolol (INDERAL) 40 MG tablet TAKE 1 TABLET BY MOUTH EVERY DAY AT NIGHT  0   No current facility-administered medications for this visit.     Past Medical History:  Diagnosis Date  . Abnormal Pap smear of cervix 02/29/2016   LSIL, well get colpo  . Abnormal uterine bleeding (AUB) 09/15/2014  . Acid reflux   . Arthritis   . BV (bacterial vaginosis) 05/02/2012  . Cancer ([place])    skin  . Chronic kidney disease    kidney stones  . Fibroadenoma of left breast 11/03/2014   Had bx 10/28/14  . Hematuria 12/18/2012  . Herpes simplex  without mention of complication   . History of kidney stones 11/14/2014  . IC (interstitial cystitis) 02/23/2015  . Interstitial cystitis   . Pelvic pain in female 09/15/2014  . PONV (postoperative nausea and vomiting)   . Trichimoniasis 12/10/2014  . Urinary frequency 11/14/2014  . UTI (lower urinary tract infection) 12/18/2012  . Vaginal discharge 12/10/2014  . Vaginal irritation 12/10/2014    Past Surgical History:  Procedure Laterality Date  . CESAREAN SECTION    . CHOLECYSTECTOMY  16 years ago   [facility]  . CYSTOSCOPY WITH RETROGRADE PYELOGRAM, URETEROSCOPY AND STENT PLACEMENT Left 10/07/2014   Procedure: CYSTOSCOPY WITH LEFT RETROGRADE PYELOGRAM, URETEROSCOPY AND LEFT STENT PLACEMENT;  Surgeon: Cleon Gustin, MD;  Location: [facility];  Service: Urology;  Laterality: Left;  . DILITATION & CURRETTAGE/HYSTROSCOPY WITH THERMACHOICE ABLATION N/A 03/09/2012   Procedure: DILATATION & CURETTAGE/HYSTEROSCOPY WITH THERMACHOICE ABLATION;  Surgeon: Florian Buff, MD;  Location: [facility];  Service: Gynecology;  Laterality: N/A;  D5W in -58ml,out 64ml, Total therapy time:39min 39sec   . LITHOTRIPSY  10/09/14  . LITHOTRIPSY  12/2015  . TONSILLECTOMY AND ADENOIDECTOMY    . TUBAL LIGATION      Social History   Socioeconomic History  . Marital status: Single    Spouse name: Not on file  . Number of children: Not on  file  . Years of education: Not on file  . Highest education level: Not on file  Social Needs  . Financial resource strain: Not on file  . Food insecurity - worry: Not on file  . Food insecurity - inability: Not on file  . Transportation needs - medical: Not on file  . Transportation needs - non-medical: Not on file  Occupational History  . Occupation: Chartered certified accountant    Comment: the [employer]  Tobacco Use  . Smoking status: Never Smoker  . Smokeless tobacco: Never Used  Substance and Sexual Activity  . Alcohol use: No    Alcohol/week: 0.0 oz  . Drug use: No  . Sexual activity:  Yes    Birth control/protection: Surgical    Comment: tubal  Other Topics Concern  . Not on file  Social History Narrative  . Not on file     Vitals:   12/22/16 0813  BP: 136/84  Pulse: 82  SpO2: 98%  Weight: 199 lb (90.3 kg)  Height: 5\' 3"  (1.6 m)    Wt Readings from Last 3 Encounters:  12/22/16 199 lb (90.3 kg)  11/14/16 198 lb (89.8 kg)  10/22/16 195 lb (88.5 kg)     PHYSICAL EXAM General: NAD HEENT: Normal. Neck: No JVD, no thyromegaly. Lungs: Clear to auscultation bilaterally with normal respiratory effort. CV: Regular rate and rhythm, normal S1/S2, no S3/S4, no murmur. No pretibial or periankle edema.  No carotid bruit.   Abdomen: Soft, nontender, no distention.  Neurologic: Alert and oriented.  Psych: Normal affect. Skin: Normal. Musculoskeletal: No gross deformities.    ECG: Most recent ECG reviewed.   Labs: Lab Results  Component Value Date/Time   K 3.5 11/14/2016 03:59 PM   BUN 11 10/22/2016 06:19 PM   CREATININE 0.63 10/22/2016 06:19 PM   ALT 15 10/04/2014 04:39 AM   HGB 13.6 10/22/2016 06:19 PM     Lipids: Lab Results  Component Value Date/Time   LDLCALC 96 05/30/2008 11:23 PM   CHOL 166 05/30/2008 11:23 PM   TRIG 72 05/30/2008 11:23 PM   HDL 56 05/30/2008 11:23 PM       ASSESSMENT AND PLAN: 1. Palpitations: Symptomatically stable.  She ran out of propranolol so I will refill at 40 mg daily.  She has occasional PACs and PVCs.  Symptoms are contributed to by stress and lack of rest.  2.  Shortness of breath: Symptoms improving.  Echocardiogram was entirely normal.  3.  Elevated blood pressure: Blood pressure is normal today.  This will need continued monitoring.  4.  Hypokalemia: I will refill potassium supplementation.  I reminded her to have her level checked.    Disposition: Follow up 6 months   Kate Sable, M.D., F.A.C.C.

## 2016-12-22 NOTE — Patient Instructions (Signed)
Your physician wants you to follow-up in: 6 months with Dr.Koneswaran You will receive a reminder letter in the mail two months in advance. If you don't receive a letter, please call our office to schedule the follow-up appointment.   Your physician recommends that you continue on your current medications as directed. Please refer to the Current Medication list given to you today.    If you need a refill on your cardiac medications before your next appointment, please call your pharmacy.     No lab work or tests ordered today.     Thank you for choosing [facility] !         

## 2016-12-27 ENCOUNTER — Telehealth: Payer: Self-pay | Admitting: Cardiovascular Disease

## 2016-12-27 NOTE — Telephone Encounter (Signed)
Per phone call--pt is needing [facility]

## 2016-12-28 ENCOUNTER — Encounter: Payer: Self-pay | Admitting: *Deleted

## 2016-12-28 MED ORDER — PROPRANOLOL HCL 40 MG PO TABS: ORAL_TABLET | ORAL | 4 refills | Status: DC

## 2016-12-28 MED FILL — PROPRANOLOL 40 MG TABLET: 40 | 90 days supply | Qty: 90 | Fill #0

## 2016-12-28 NOTE — Telephone Encounter (Signed)
Propranolol rx sent to [facility]. Patient advised via [employer].

## 2017-01-19 MED FILL — valACYclovir HCL 1 GM TABS: 1 | 90 days supply | Qty: 90 | Fill #1

## 2017-01-23 DIAGNOSIS — J019 Acute sinusitis, unspecified: Secondary | ICD-10-CM | POA: Diagnosis not present

## 2017-01-23 DIAGNOSIS — F330 Major depressive disorder, recurrent, mild: Secondary | ICD-10-CM | POA: Diagnosis not present

## 2017-01-23 DIAGNOSIS — Z6835 Body mass index (BMI) 35.0-35.9, adult: Secondary | ICD-10-CM | POA: Diagnosis not present

## 2017-02-20 DIAGNOSIS — F330 Major depressive disorder, recurrent, mild: Secondary | ICD-10-CM | POA: Diagnosis not present

## 2017-02-20 DIAGNOSIS — K219 Gastro-esophageal reflux disease without esophagitis: Secondary | ICD-10-CM | POA: Diagnosis not present

## 2017-03-07 MED FILL — ELMIRON 100 MG CAPSULE: 100 | 30 days supply | Qty: 120 | Fill #6

## 2017-03-24 MED FILL — ESCITALOPRAM 10 MG TABLET: 10 | 90 days supply | Qty: 90 | Fill #0

## 2017-04-10 MED FILL — valACYclovir HCL 1 GM TABS: 1 | 90 days supply | Qty: 90 | Fill #2

## 2017-04-21 DIAGNOSIS — N3010 Interstitial cystitis (chronic) without hematuria: Secondary | ICD-10-CM | POA: Diagnosis not present

## 2017-04-21 DIAGNOSIS — N200 Calculus of kidney: Secondary | ICD-10-CM | POA: Diagnosis not present

## 2017-05-17 DIAGNOSIS — D045 Carcinoma in situ of skin of trunk: Secondary | ICD-10-CM | POA: Diagnosis not present

## 2017-05-17 DIAGNOSIS — C44519 Basal cell carcinoma of skin of other part of trunk: Secondary | ICD-10-CM | POA: Diagnosis not present

## 2017-05-17 DIAGNOSIS — L570 Actinic keratosis: Secondary | ICD-10-CM | POA: Diagnosis not present

## 2017-05-17 DIAGNOSIS — X32XXXA Exposure to sunlight, initial encounter: Secondary | ICD-10-CM | POA: Diagnosis not present

## 2017-05-29 ENCOUNTER — Other Ambulatory Visit: Payer: Self-pay

## 2017-05-29 ENCOUNTER — Other Ambulatory Visit (HOSPITAL_COMMUNITY)
Admission: RE | Admit: 2017-05-29 | Discharge: 2017-05-29 | Disposition: A | Discharge status: Home / Self Care | Payer: 59 | Source: Ambulatory Visit | Attending: Adult Health | Admitting: Adult Health

## 2017-05-29 ENCOUNTER — Ambulatory Visit (INDEPENDENT_AMBULATORY_CARE_PROVIDER_SITE_OTHER): Payer: 59 | Admitting: Adult Health

## 2017-05-29 ENCOUNTER — Encounter: Payer: Self-pay | Admitting: Adult Health

## 2017-05-29 VITALS — BP 110/72 | HR 81 | Resp 16 | Ht 63.0 in | Wt 197.0 lb

## 2017-05-29 DIAGNOSIS — Z1211 Encounter for screening for malignant neoplasm of colon: Secondary | ICD-10-CM | POA: Insufficient documentation

## 2017-05-29 DIAGNOSIS — Z01419 Encounter for gynecological examination (general) (routine) without abnormal findings: Secondary | ICD-10-CM

## 2017-05-29 DIAGNOSIS — Z1212 Encounter for screening for malignant neoplasm of rectum: Secondary | ICD-10-CM

## 2017-05-29 DIAGNOSIS — Z8742 Personal history of other diseases of the female genital tract: Secondary | ICD-10-CM

## 2017-05-29 DIAGNOSIS — Z01411 Encounter for gynecological examination (general) (routine) with abnormal findings: Secondary | ICD-10-CM

## 2017-05-29 LAB — HEMOCCULT GUIAC POC 1CARD (OFFICE): Fecal Occult Blood, POC: NEGATIVE

## 2017-05-29 MED ORDER — FLUCONAZOLE 150 MG PO TABS: ORAL_TABLET | ORAL | 3 refills | Status: DC

## 2017-05-29 NOTE — Progress Notes (Signed)
Patient ID: Marissa Trevino, female   DOB: 1975-12-30, 42 y.o.   MRN: [id number] History of Present Illness:  Marissa Trevino is a 42 year old white female in for a well woman gyn exam and pap, she last pap was LSIL 02/24/16 with negative HPV.She had colpo in February 2019 and path was normal.  PCP is [facility].   Current Medications, Allergies, Past Medical History, Past Surgical History, Family History and Social History were reviewed in [employer] record.     Review of Systems:  Patient denies any headaches, hearing loss, fatigue, blurred vision, shortness of breath, chest pain, abdominal pain, problems with bowel movements, urination(bladder doing good), or intercourse. No joint pain or mood swings. Has vaginal itching after using condoms.  Physical Exam:BP 110/72 (BP Location: Right Arm, Patient Position: Sitting, Cuff Size: Normal)   Pulse 81   Resp 16   Ht 5\' 3"  (1.6 m)   Wt 197 lb (89.4 kg)   LMP 05/19/2017   BMI 34.90 kg/m  General:  Well developed, well nourished, no acute distress Skin:  Warm and dry,tan, has several healing area from dermatology freezing.  Neck:  Midline trachea, normal thyroid, good ROM, no lymphadenopathy Lungs; Clear to auscultation bilaterally Breast:  No dominant palpable mass, retraction, or nipple discharge Cardiovascular: Regular rate and rhythm Abdomen:  Soft, non tender, no hepatosplenomegaly Pelvic:  External genitalia is normal in appearance, no lesions.  The vagina is normal in appearance. Urethra has no lesions or masses. The cervix is bulbous.  Uterus is felt to be normal size, shape, and contour.  No adnexal masses or tenderness noted.Bladder is non tender, no masses felt. Rectal: Good sphincter tone, no polyps, or hemorrhoids felt.  Hemoccult negative. Extremities/musculoskeletal:  No swelling or varicosities noted, no clubbing or cyanosis Psych:  No mood changes, alert and cooperative,seems happy PHQ 2 score 1.  Impression: 1.  Encounter for gynecological examination with Papanicolaou smear of cervix   2. Screening for colorectal cancer   3. History of abnormal cervical Pap smear       Plan: Physical in 1 year Pap in 3 if normal  Mammogram yearly Labs with PCP Meds ordered this encounter  Medications  . fluconazole (DIFLUCAN) 150 MG tablet    Sig: Take 1 now and 1 in 3 days    Dispense:  2 tablet    Refill:  3    Order Specific Question:   Supervising Provider    Answer:   Tania Ade H [[id number]]

## 2017-05-31 DIAGNOSIS — Z6834 Body mass index (BMI) 34.0-34.9, adult: Secondary | ICD-10-CM | POA: Diagnosis not present

## 2017-05-31 DIAGNOSIS — J019 Acute sinusitis, unspecified: Secondary | ICD-10-CM | POA: Diagnosis not present

## 2017-05-31 DIAGNOSIS — F330 Major depressive disorder, recurrent, mild: Secondary | ICD-10-CM | POA: Diagnosis not present

## 2017-05-31 DIAGNOSIS — Z6835 Body mass index (BMI) 35.0-35.9, adult: Secondary | ICD-10-CM | POA: Diagnosis not present

## 2017-05-31 DIAGNOSIS — K219 Gastro-esophageal reflux disease without esophagitis: Secondary | ICD-10-CM | POA: Diagnosis not present

## 2017-05-31 LAB — CYTOLOGY - PAP
Diagnosis: NEGATIVE
HPV: NOT DETECTED

## 2017-06-28 DIAGNOSIS — X32XXXD Exposure to sunlight, subsequent encounter: Secondary | ICD-10-CM | POA: Diagnosis not present

## 2017-06-28 DIAGNOSIS — Z85828 Personal history of other malignant neoplasm of skin: Secondary | ICD-10-CM | POA: Diagnosis not present

## 2017-06-28 DIAGNOSIS — Z08 Encounter for follow-up examination after completed treatment for malignant neoplasm: Secondary | ICD-10-CM | POA: Diagnosis not present

## 2017-06-28 DIAGNOSIS — L570 Actinic keratosis: Secondary | ICD-10-CM | POA: Diagnosis not present

## 2017-07-31 DIAGNOSIS — M722 Plantar fascial fibromatosis: Secondary | ICD-10-CM | POA: Diagnosis not present

## 2017-07-31 DIAGNOSIS — M79672 Pain in left foot: Secondary | ICD-10-CM | POA: Diagnosis not present

## 2017-08-21 ENCOUNTER — Other Ambulatory Visit: Payer: Self-pay | Admitting: Obstetrics & Gynecology

## 2017-08-21 MED FILL — ELMIRON 100 MG CAPSULE: 100 | 30 days supply | Qty: 120 | Fill #0

## 2017-08-24 ENCOUNTER — Telehealth: Payer: Self-pay | Admitting: Gastroenterology

## 2017-08-24 NOTE — Telephone Encounter (Signed)
Pt said she has been having problems for sometime with foods such as buritos, chicken and bread not going down very well. She has regurgitation and burping. Liquids usually go down OK. She has never been started on a PPI and thinks that might help.   We have not seen her for 3 years, so I told her we cannot make any recommendations.  She plans to see Dr. Nevada Crane tomorrow and has scheduled an appt here ( our earliest) in early Nov.   She is aware if food gets stuck and she has more problems to go to the ED.  Forwarding to Walden Field, NP who is on hospital call for this afternoon.

## 2017-08-24 NOTE — Telephone Encounter (Signed)
Noted  

## 2017-08-24 NOTE — Telephone Encounter (Signed)
Agree, no further recommendations. PPI may help. We can see her for dysphagia and if persistent at her appointment, can set her up for EGD +/- dilation

## 2017-08-24 NOTE — Telephone Encounter (Signed)
Pt called to say that she is having problems swallowing and it feels like something getting stuck in her throat. She is going to the beach Sunday and will be gone a week and wanted to know what she should do. We haven't seen her since 2016 and there are no openings until NOV. Please advise patient. [phone]

## 2017-08-25 DIAGNOSIS — F411 Generalized anxiety disorder: Secondary | ICD-10-CM | POA: Diagnosis not present

## 2017-08-25 DIAGNOSIS — R1310 Dysphagia, unspecified: Secondary | ICD-10-CM | POA: Diagnosis not present

## 2017-08-25 DIAGNOSIS — Z6834 Body mass index (BMI) 34.0-34.9, adult: Secondary | ICD-10-CM | POA: Diagnosis not present

## 2017-08-25 DIAGNOSIS — K219 Gastro-esophageal reflux disease without esophagitis: Secondary | ICD-10-CM | POA: Diagnosis not present

## 2017-08-25 MED FILL — buPROPion HCL ER (XL) 150 M: 150 | 30 days supply | Qty: 30 | Fill #0

## 2017-08-25 MED FILL — OMEPRAZOLE 40 MG CPDR: 40 | 90 days supply | Qty: 90 | Fill #0

## 2017-08-25 MED FILL — ALPRAZolam 0.25 MG TABS: 0.25 | 15 days supply | Qty: 15 | Fill #0

## 2017-09-04 DIAGNOSIS — M79672 Pain in left foot: Secondary | ICD-10-CM | POA: Diagnosis not present

## 2017-09-04 DIAGNOSIS — M722 Plantar fascial fibromatosis: Secondary | ICD-10-CM | POA: Diagnosis not present

## 2017-10-06 DIAGNOSIS — F411 Generalized anxiety disorder: Secondary | ICD-10-CM | POA: Diagnosis not present

## 2017-10-06 DIAGNOSIS — K219 Gastro-esophageal reflux disease without esophagitis: Secondary | ICD-10-CM | POA: Diagnosis not present

## 2017-10-06 DIAGNOSIS — Z79899 Other long term (current) drug therapy: Secondary | ICD-10-CM | POA: Diagnosis not present

## 2017-10-06 DIAGNOSIS — Z6835 Body mass index (BMI) 35.0-35.9, adult: Secondary | ICD-10-CM | POA: Diagnosis not present

## 2017-10-06 DIAGNOSIS — J019 Acute sinusitis, unspecified: Secondary | ICD-10-CM | POA: Diagnosis not present

## 2017-10-28 IMAGING — DX DG ABDOMEN 1V
2 series · 2 of 2 positions shown · non-contrast
Comparison: Abdominal radiograph dated 01/04/2016 and CT dated
01/02/2016

CLINICAL DATA: 40-year-old female status post recent lithotripsy
presenting with increased left-sided abdominal pain.

EXAM:
ABDOMEN - 1 VIEW

[abdomen kub (1 of 2)]
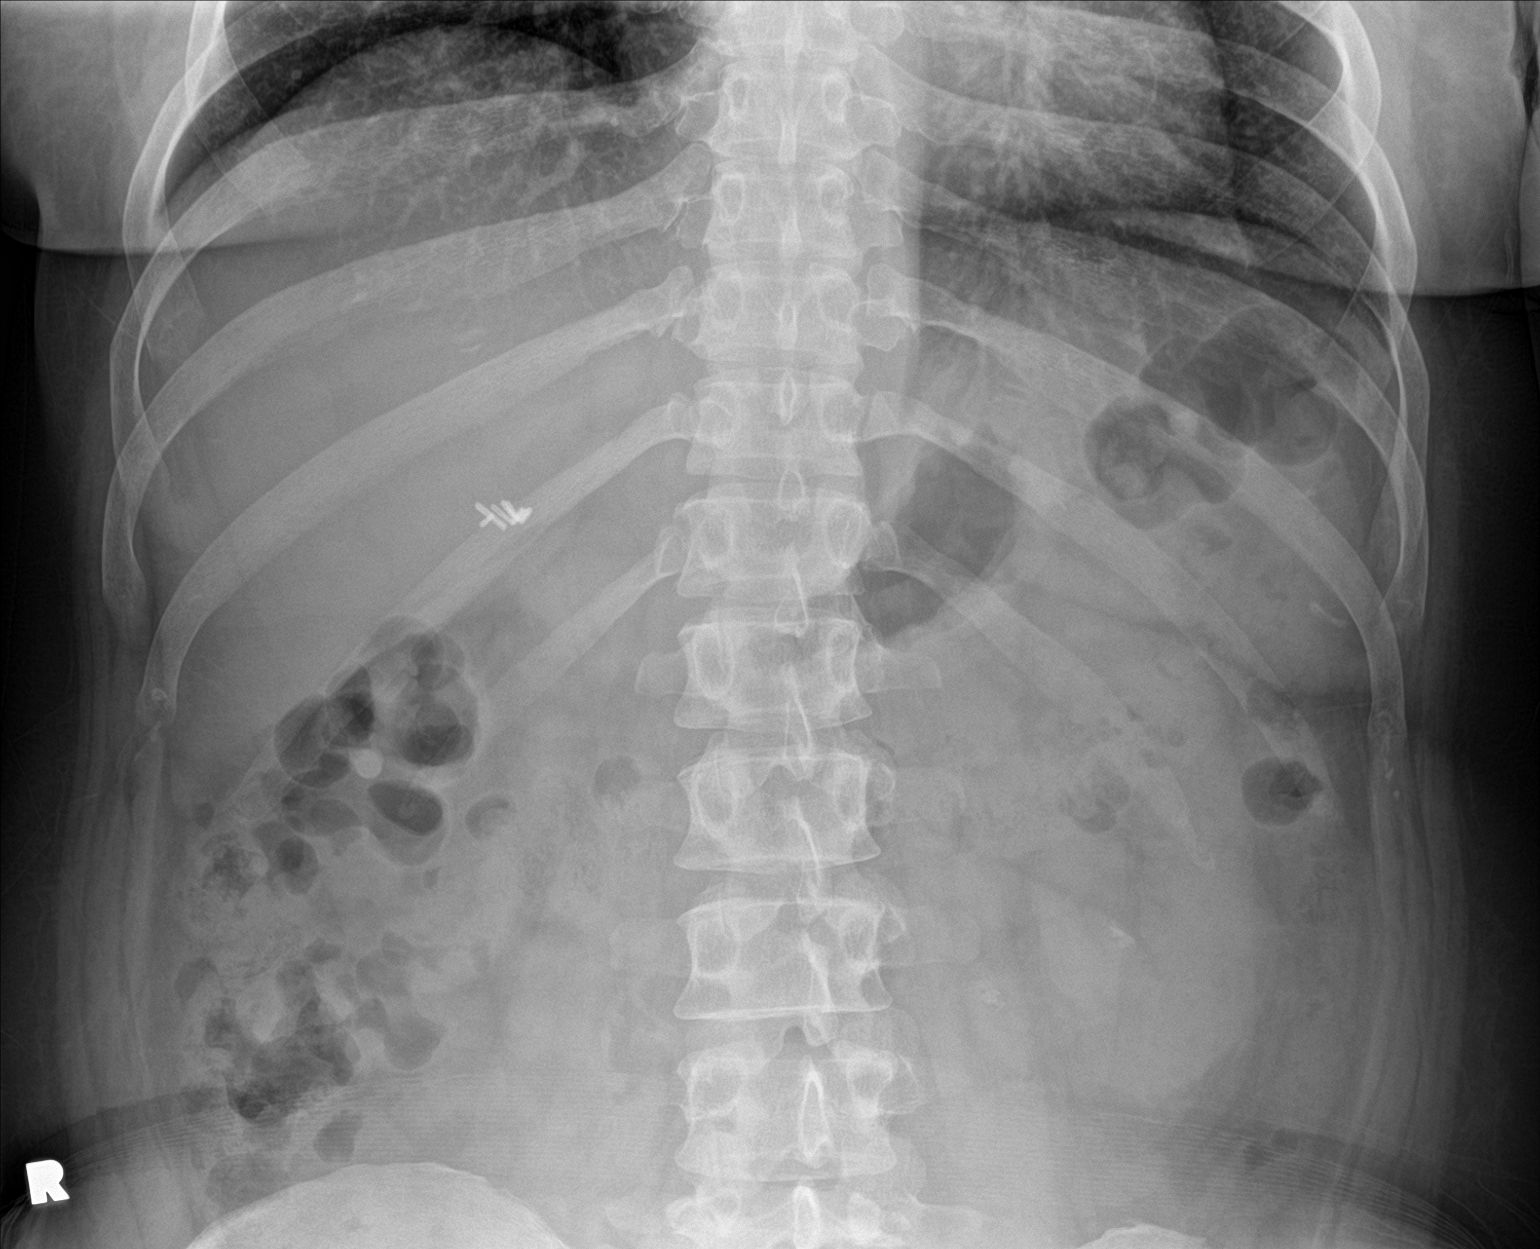

[abdomen kub (2 of 2)]
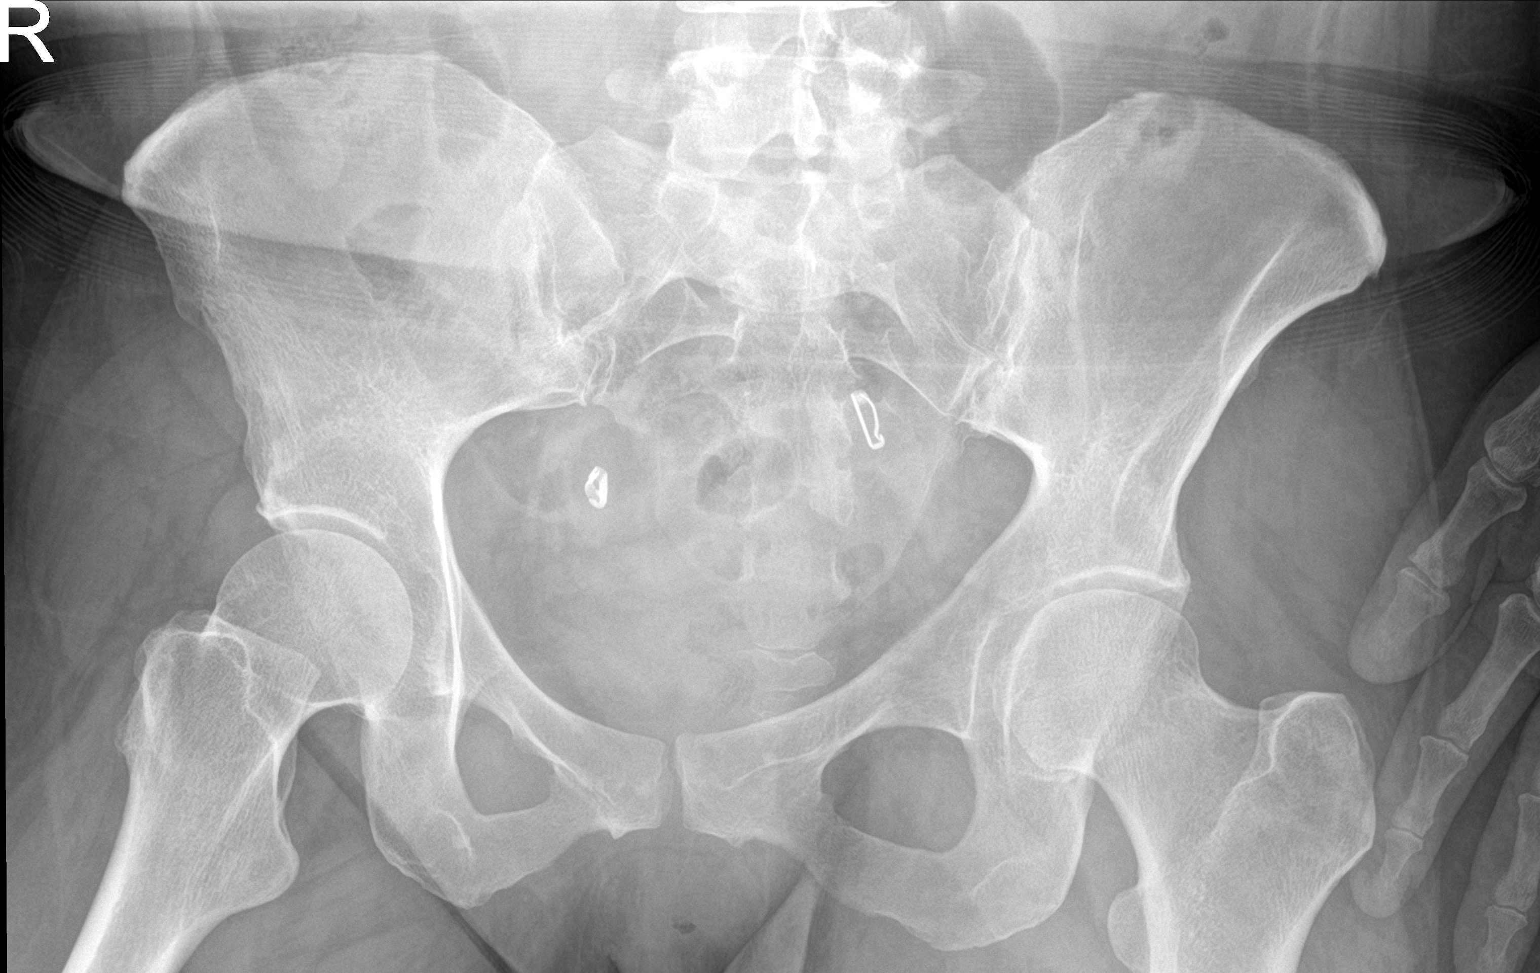

[2 of 2 positions shown; findings below may reference images not displayed]

FINDINGS: There is a 5 mm radiopaque focus over the inferior left renal
silhouette corresponding to the stone seen on the prior CT. There is
faint 5 mm radiopaque structure inferior to the left L3 transverse
process representing stone or cluster of small stones fragments in
the proximal left ureter. This appears more distal within the ureter
compared to the stone seen on the prior CT. No definite radiopaque
calculi identified along the course of the left ureter distally over
the pelvis. No stone is seen over the right renal silhouette or
along the course of the right ureter.

There is moderate stool throughout the colon. No bowel dilatation or
evidence of obstruction. No free air identified. Right upper
quadrant cholecystectomy clips as well as bilateral tubal ligation
clips noted. The osseous structures and soft tissues are grossly
unremarkable.
IMPRESSION: A 5 mm stone in the inferior pole of the left kidney.

A 5 mm stone or cluster of adjacent small stones within the proximal
left ureter appear more distal in position compared to the left UPJ
stone seen on the prior CT.

## 2017-11-27 MED FILL — SERTRALINE HCL 50 MG TABLET: 50 | 90 days supply | Qty: 90 | Fill #0

## 2017-12-15 DIAGNOSIS — H524 Presbyopia: Secondary | ICD-10-CM | POA: Diagnosis not present

## 2017-12-15 DIAGNOSIS — H52223 Regular astigmatism, bilateral: Secondary | ICD-10-CM | POA: Diagnosis not present

## 2017-12-15 DIAGNOSIS — H5213 Myopia, bilateral: Secondary | ICD-10-CM | POA: Diagnosis not present

## 2017-12-15 MED FILL — PROPRANOLOL 40 MG TABLET: 40 | 90 days supply | Qty: 90 | Fill #1

## 2017-12-15 MED FILL — OMEPRAZOLE 40 MG CPDR: 40 | 90 days supply | Qty: 90 | Fill #1

## 2017-12-27 DIAGNOSIS — L570 Actinic keratosis: Secondary | ICD-10-CM | POA: Diagnosis not present

## 2017-12-27 DIAGNOSIS — X32XXXD Exposure to sunlight, subsequent encounter: Secondary | ICD-10-CM | POA: Diagnosis not present

## 2017-12-27 DIAGNOSIS — Z08 Encounter for follow-up examination after completed treatment for malignant neoplasm: Secondary | ICD-10-CM | POA: Diagnosis not present

## 2017-12-27 DIAGNOSIS — Z1283 Encounter for screening for malignant neoplasm of skin: Secondary | ICD-10-CM | POA: Diagnosis not present

## 2017-12-27 DIAGNOSIS — Z85828 Personal history of other malignant neoplasm of skin: Secondary | ICD-10-CM | POA: Diagnosis not present

## 2018-02-20 ENCOUNTER — Other Ambulatory Visit (HOSPITAL_COMMUNITY): Payer: Self-pay | Admitting: Adult Health

## 2018-02-20 DIAGNOSIS — Z1231 Encounter for screening mammogram for malignant neoplasm of breast: Secondary | ICD-10-CM

## 2018-02-26 ENCOUNTER — Ambulatory Visit (HOSPITAL_COMMUNITY): Payer: 59

## 2018-02-26 ENCOUNTER — Ambulatory Visit (HOSPITAL_COMMUNITY)
Admission: RE | Admit: 2018-02-26 | Discharge: 2018-02-26 | Disposition: A | Discharge status: Home / Self Care | Payer: 59 | Source: Ambulatory Visit | Attending: Adult Health | Admitting: Adult Health

## 2018-02-26 DIAGNOSIS — Z1231 Encounter for screening mammogram for malignant neoplasm of breast: Secondary | ICD-10-CM | POA: Insufficient documentation

## 2018-03-05 DIAGNOSIS — R1084 Generalized abdominal pain: Secondary | ICD-10-CM | POA: Diagnosis not present

## 2018-03-05 DIAGNOSIS — N3010 Interstitial cystitis (chronic) without hematuria: Secondary | ICD-10-CM | POA: Diagnosis not present

## 2018-03-05 DIAGNOSIS — N200 Calculus of kidney: Secondary | ICD-10-CM | POA: Diagnosis not present

## 2018-04-03 ENCOUNTER — Other Ambulatory Visit: Payer: Self-pay | Admitting: Cardiovascular Disease

## 2018-04-03 MED FILL — POTASSIUM CHLORIDE CRYS ER: 20 | 90 days supply | Qty: 90 | Fill #0

## 2018-05-21 NOTE — Progress Notes (Signed)
REVIEWED-NO ADDITIONAL RECOMMENDATIONS. 

## 2018-06-06 MED FILL — OMEPRAZOLE 40 MG CPDR: 40 | 90 days supply | Qty: 90 | Fill #0

## 2018-06-07 DIAGNOSIS — Z6834 Body mass index (BMI) 34.0-34.9, adult: Secondary | ICD-10-CM | POA: Diagnosis not present

## 2018-06-07 DIAGNOSIS — Z79899 Other long term (current) drug therapy: Secondary | ICD-10-CM | POA: Diagnosis not present

## 2018-06-15 DIAGNOSIS — F411 Generalized anxiety disorder: Secondary | ICD-10-CM | POA: Diagnosis not present

## 2018-06-15 DIAGNOSIS — E782 Mixed hyperlipidemia: Secondary | ICD-10-CM | POA: Diagnosis not present

## 2018-06-15 DIAGNOSIS — E876 Hypokalemia: Secondary | ICD-10-CM | POA: Diagnosis not present

## 2018-06-15 DIAGNOSIS — N3010 Interstitial cystitis (chronic) without hematuria: Secondary | ICD-10-CM | POA: Diagnosis not present

## 2018-06-15 DIAGNOSIS — K219 Gastro-esophageal reflux disease without esophagitis: Secondary | ICD-10-CM | POA: Diagnosis not present

## 2018-06-15 DIAGNOSIS — R7301 Impaired fasting glucose: Secondary | ICD-10-CM | POA: Diagnosis not present

## 2018-06-15 DIAGNOSIS — M25512 Pain in left shoulder: Secondary | ICD-10-CM | POA: Diagnosis not present

## 2018-07-02 DIAGNOSIS — L570 Actinic keratosis: Secondary | ICD-10-CM | POA: Diagnosis not present

## 2018-07-02 DIAGNOSIS — Z08 Encounter for follow-up examination after completed treatment for malignant neoplasm: Secondary | ICD-10-CM | POA: Diagnosis not present

## 2018-07-02 DIAGNOSIS — X32XXXD Exposure to sunlight, subsequent encounter: Secondary | ICD-10-CM | POA: Diagnosis not present

## 2018-07-02 DIAGNOSIS — D045 Carcinoma in situ of skin of trunk: Secondary | ICD-10-CM | POA: Diagnosis not present

## 2018-07-02 DIAGNOSIS — Z85828 Personal history of other malignant neoplasm of skin: Secondary | ICD-10-CM | POA: Diagnosis not present

## 2018-08-06 DIAGNOSIS — R0981 Nasal congestion: Secondary | ICD-10-CM | POA: Diagnosis not present

## 2018-08-06 DIAGNOSIS — J019 Acute sinusitis, unspecified: Secondary | ICD-10-CM | POA: Diagnosis not present

## 2018-08-15 IMAGING — DX DG CHEST 2V
2 series · 2 of 2 positions shown · non-contrast
Comparison: 02/26/2016.

CLINICAL DATA: Chest pain and shortness of breath.  Palpitations.

EXAM:
CHEST  2 VIEW

[chest pa]
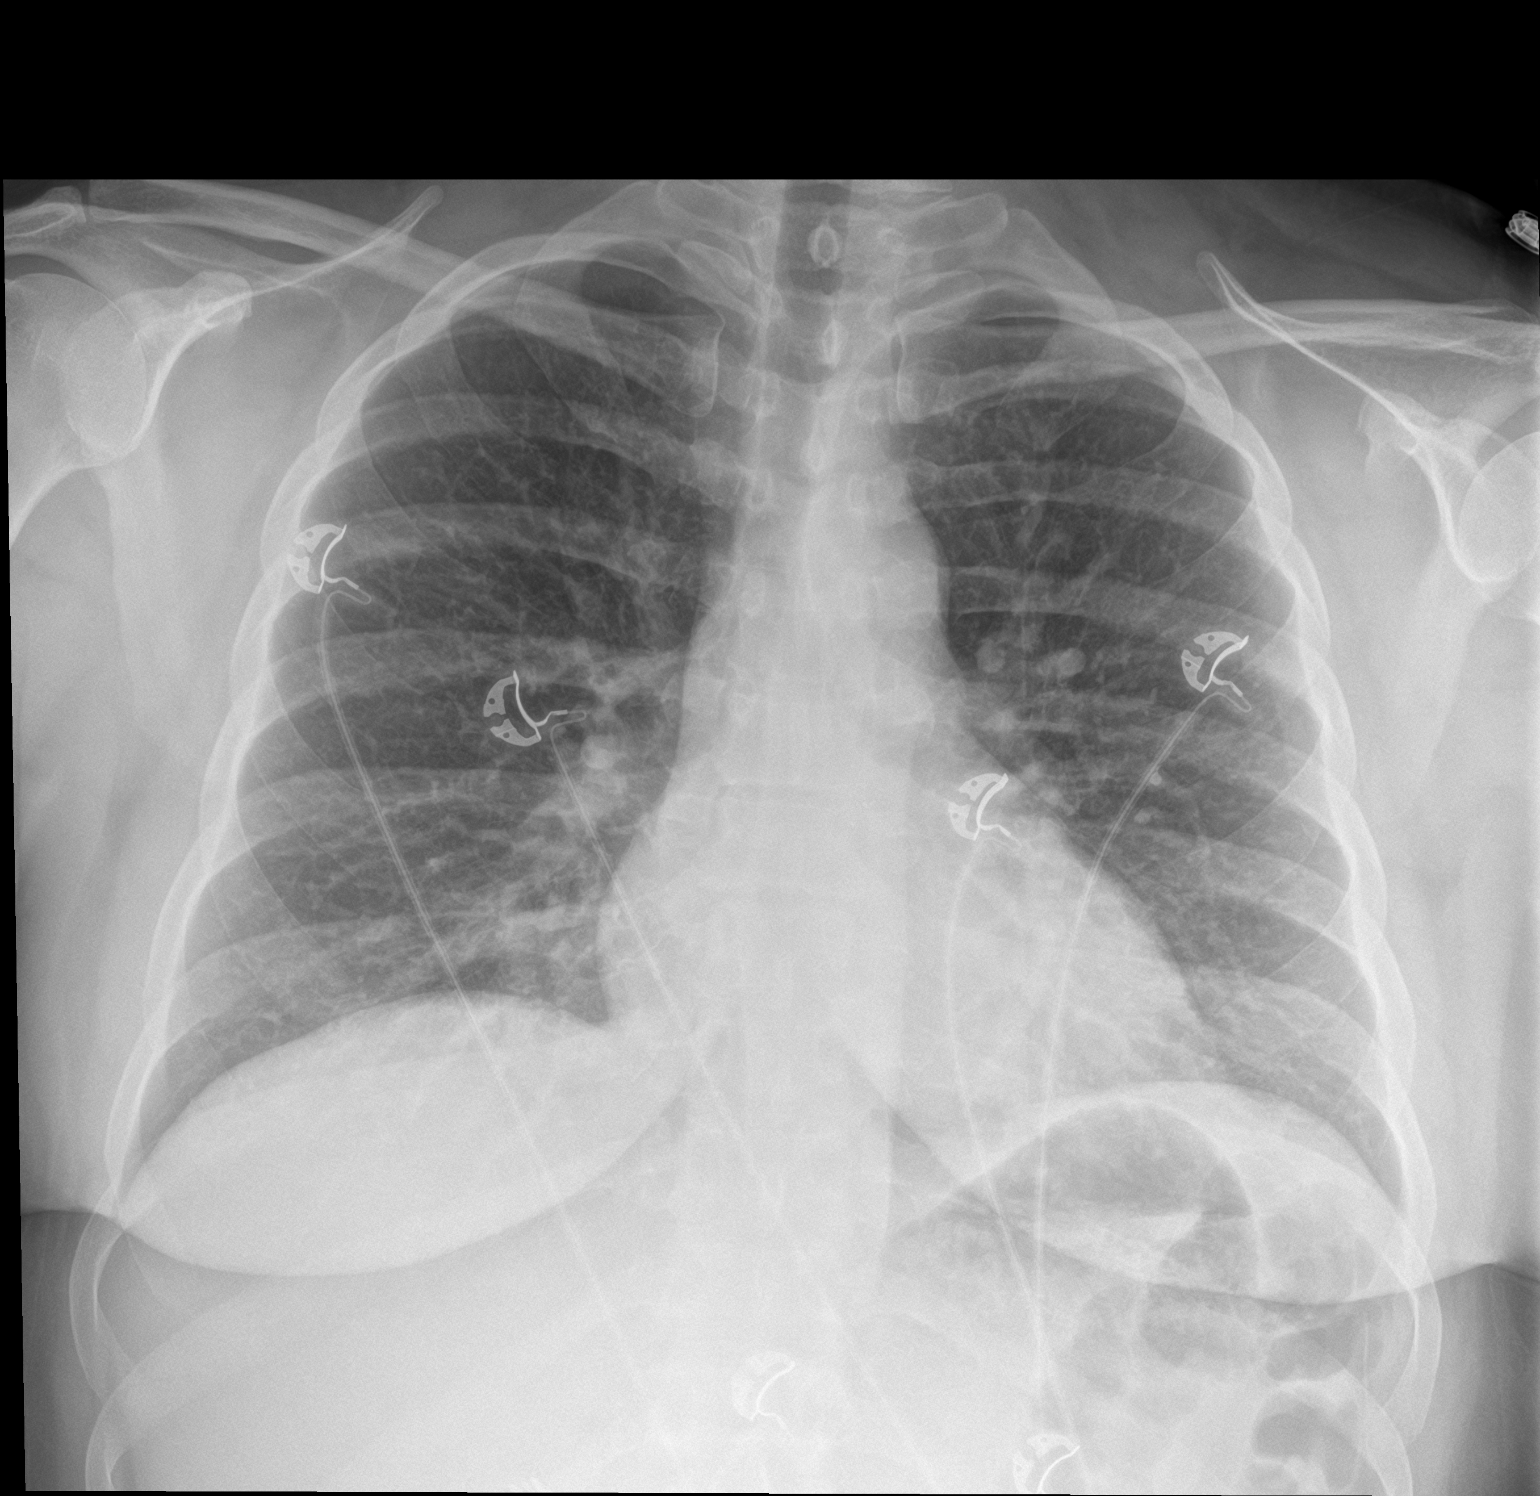

[chest lat]
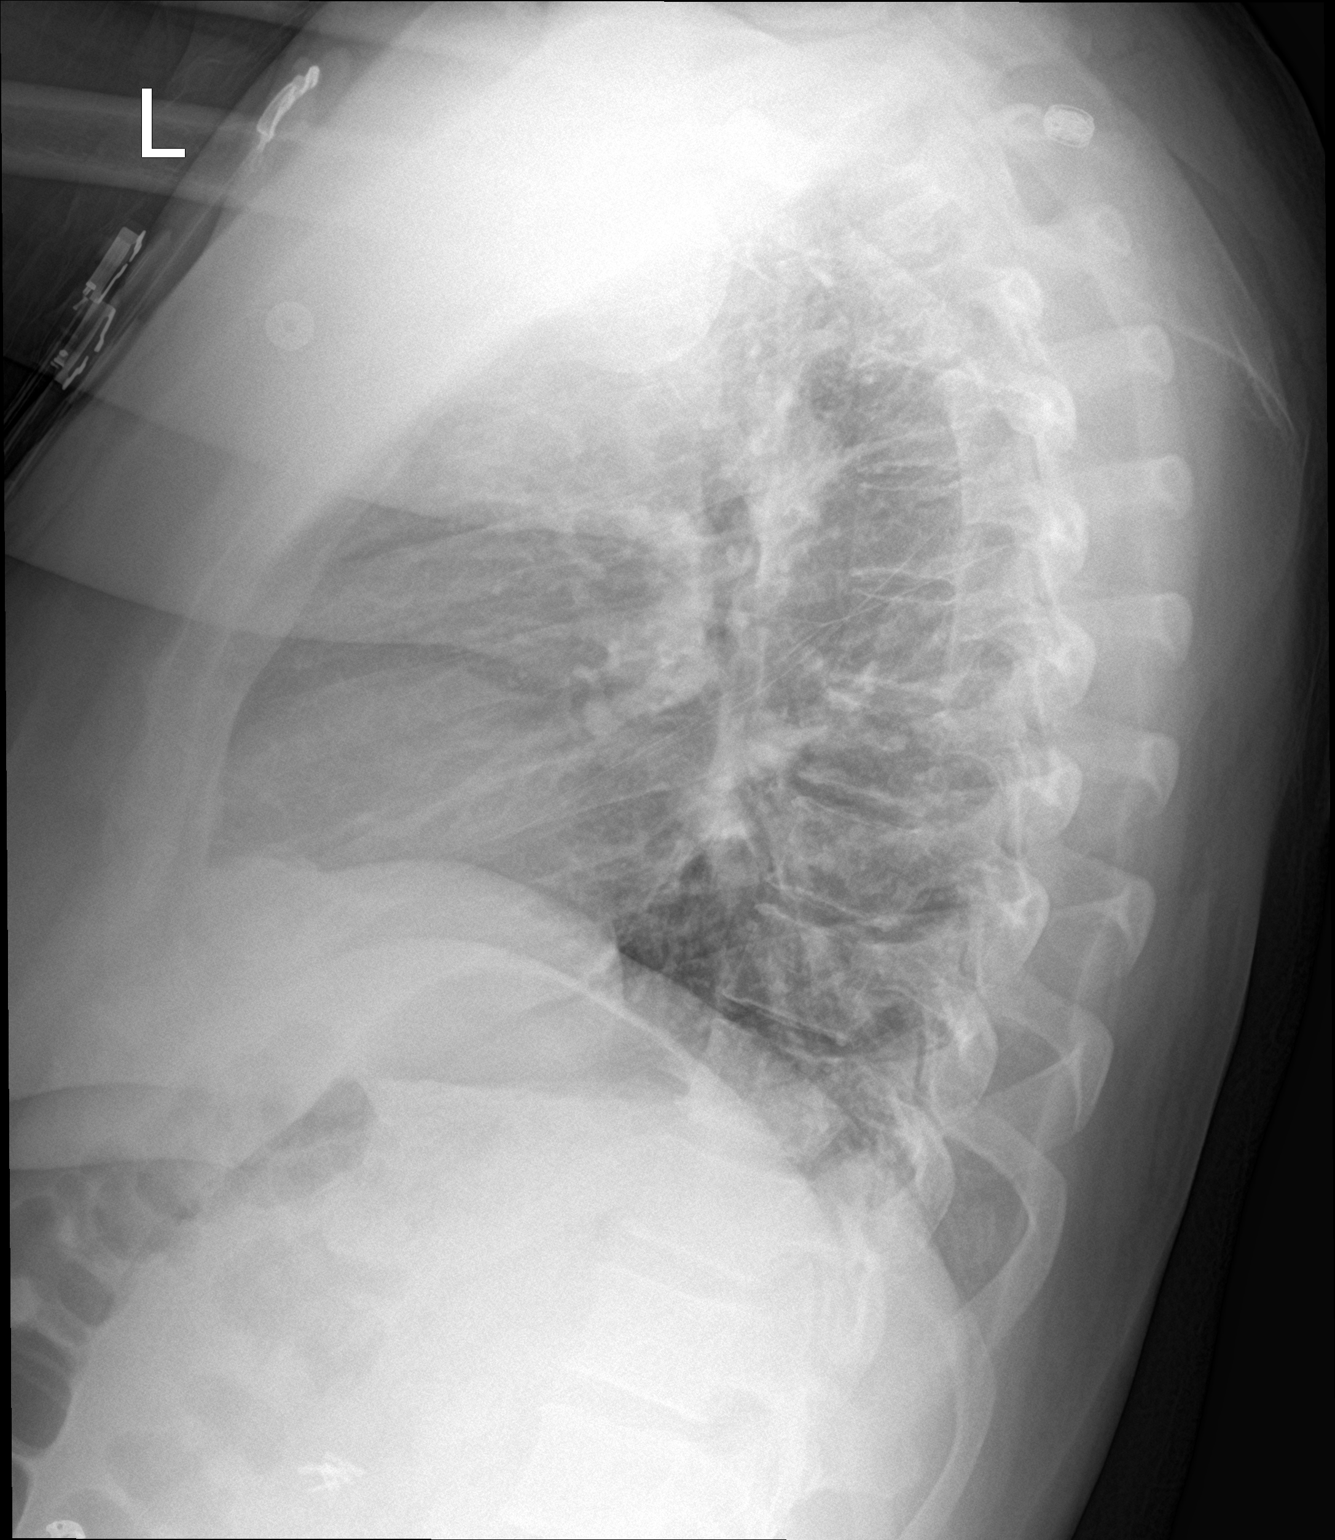

[2 of 2 positions shown; findings below may reference images not displayed]

FINDINGS: Normal sized heart. Clear lungs with normal vascularity. Minimal
thoracic spine degenerative changes. Cholecystectomy clips.
IMPRESSION: No acute abnormality.

## 2018-08-27 ENCOUNTER — Other Ambulatory Visit: Payer: Self-pay | Admitting: Adult Health

## 2018-08-27 MED FILL — valACYclovir HCL 1 GM TABS: 1 | 90 days supply | Qty: 90 | Fill #0

## 2018-08-28 DIAGNOSIS — Z20828 Contact with and (suspected) exposure to other viral communicable diseases: Secondary | ICD-10-CM | POA: Diagnosis not present

## 2018-09-03 DIAGNOSIS — M546 Pain in thoracic spine: Secondary | ICD-10-CM | POA: Diagnosis not present

## 2018-09-03 DIAGNOSIS — M9902 Segmental and somatic dysfunction of thoracic region: Secondary | ICD-10-CM | POA: Diagnosis not present

## 2018-09-03 DIAGNOSIS — M5413 Radiculopathy, cervicothoracic region: Secondary | ICD-10-CM | POA: Diagnosis not present

## 2018-09-03 DIAGNOSIS — M542 Cervicalgia: Secondary | ICD-10-CM | POA: Diagnosis not present

## 2018-09-03 DIAGNOSIS — M9901 Segmental and somatic dysfunction of cervical region: Secondary | ICD-10-CM | POA: Diagnosis not present

## 2018-09-05 DIAGNOSIS — M542 Cervicalgia: Secondary | ICD-10-CM | POA: Diagnosis not present

## 2018-09-05 DIAGNOSIS — M9901 Segmental and somatic dysfunction of cervical region: Secondary | ICD-10-CM | POA: Diagnosis not present

## 2018-09-05 DIAGNOSIS — M546 Pain in thoracic spine: Secondary | ICD-10-CM | POA: Diagnosis not present

## 2018-09-05 DIAGNOSIS — M9902 Segmental and somatic dysfunction of thoracic region: Secondary | ICD-10-CM | POA: Diagnosis not present

## 2018-09-05 DIAGNOSIS — M5413 Radiculopathy, cervicothoracic region: Secondary | ICD-10-CM | POA: Diagnosis not present

## 2018-09-10 DIAGNOSIS — M9902 Segmental and somatic dysfunction of thoracic region: Secondary | ICD-10-CM | POA: Diagnosis not present

## 2018-09-10 DIAGNOSIS — M542 Cervicalgia: Secondary | ICD-10-CM | POA: Diagnosis not present

## 2018-09-10 DIAGNOSIS — M5413 Radiculopathy, cervicothoracic region: Secondary | ICD-10-CM | POA: Diagnosis not present

## 2018-09-10 DIAGNOSIS — M9901 Segmental and somatic dysfunction of cervical region: Secondary | ICD-10-CM | POA: Diagnosis not present

## 2018-09-10 DIAGNOSIS — M546 Pain in thoracic spine: Secondary | ICD-10-CM | POA: Diagnosis not present

## 2018-09-17 DIAGNOSIS — M5413 Radiculopathy, cervicothoracic region: Secondary | ICD-10-CM | POA: Diagnosis not present

## 2018-09-17 DIAGNOSIS — M542 Cervicalgia: Secondary | ICD-10-CM | POA: Diagnosis not present

## 2018-09-17 DIAGNOSIS — M9902 Segmental and somatic dysfunction of thoracic region: Secondary | ICD-10-CM | POA: Diagnosis not present

## 2018-09-17 DIAGNOSIS — M546 Pain in thoracic spine: Secondary | ICD-10-CM | POA: Diagnosis not present

## 2018-09-17 DIAGNOSIS — M9901 Segmental and somatic dysfunction of cervical region: Secondary | ICD-10-CM | POA: Diagnosis not present

## 2018-09-24 DIAGNOSIS — M546 Pain in thoracic spine: Secondary | ICD-10-CM | POA: Diagnosis not present

## 2018-09-24 DIAGNOSIS — M9901 Segmental and somatic dysfunction of cervical region: Secondary | ICD-10-CM | POA: Diagnosis not present

## 2018-09-24 DIAGNOSIS — M9902 Segmental and somatic dysfunction of thoracic region: Secondary | ICD-10-CM | POA: Diagnosis not present

## 2018-09-24 DIAGNOSIS — M542 Cervicalgia: Secondary | ICD-10-CM | POA: Diagnosis not present

## 2018-09-24 DIAGNOSIS — M5413 Radiculopathy, cervicothoracic region: Secondary | ICD-10-CM | POA: Diagnosis not present

## 2018-09-26 DIAGNOSIS — Z029 Encounter for administrative examinations, unspecified: Secondary | ICD-10-CM

## 2018-09-27 MED FILL — OMEPRAZOLE 40 MG CPDR: 40 | 90 days supply | Qty: 90 | Fill #1

## 2018-10-15 DIAGNOSIS — N3010 Interstitial cystitis (chronic) without hematuria: Secondary | ICD-10-CM | POA: Diagnosis not present

## 2018-10-15 DIAGNOSIS — N898 Other specified noninflammatory disorders of vagina: Secondary | ICD-10-CM | POA: Diagnosis not present

## 2018-11-26 MED FILL — POTASSIUM CHLORIDE CRYS ER: 20 | 90 days supply | Qty: 90 | Fill #1

## 2019-01-28 ENCOUNTER — Ambulatory Visit: Payer: 59

## 2019-02-14 MED FILL — OMEPRAZOLE 40 MG CPDR: 40 | 90 days supply | Qty: 90 | Fill #0

## 2019-02-21 ENCOUNTER — Other Ambulatory Visit (INDEPENDENT_AMBULATORY_CARE_PROVIDER_SITE_OTHER): Payer: 59

## 2019-02-21 ENCOUNTER — Other Ambulatory Visit: Payer: Self-pay

## 2019-02-21 VITALS — Ht 63.0 in | Wt 198.0 lb

## 2019-02-21 DIAGNOSIS — R52 Pain, unspecified: Secondary | ICD-10-CM

## 2019-02-21 LAB — POCT URINALYSIS DIPSTICK OB
Glucose, UA: NEGATIVE
Leukocytes, UA: NEGATIVE
Nitrite, UA: NEGATIVE
POC,PROTEIN,UA: NEGATIVE

## 2019-02-21 NOTE — Progress Notes (Signed)
Chart reviewed for nurse visit. Agree with plan of care.  Estill Dooms, NP 02/21/2019 12:34 PM

## 2019-02-21 NOTE — Progress Notes (Signed)
   NURSE VISIT- UTI SYMPTOMS   SUBJECTIVE:  Marissa Trevino is a 44 y.o. G3P3 female here for UTI symptoms. She is a GYN patient. She reports dysuria.  OBJECTIVE:  Ht 5\' 3"  (1.6 m)   Wt 198 lb (89.8 kg)   BMI 35.07 kg/m   Appears well, in no apparent distress  Results for orders placed or performed in visit on 02/21/19 (from the past 24 hour(s))  POC Urinalysis Dipstick OB   Collection Time: 02/21/19 11:45 AM  Result Value Ref Range   Color, UA     Clarity, UA     Glucose, UA Negative Negative   Bilirubin, UA     Ketones, UA small    Spec Grav, UA     Blood, UA small    pH, UA     POC,PROTEIN,UA Negative Negative, Trace, Small (1+), Moderate (2+), Large (3+), 4+   Urobilinogen, UA     Nitrite, UA neg    Leukocytes, UA Negative Negative   Appearance     Odor      ASSESSMENT: GYN patient with UTI symptoms and negative nitrites  PLAN: Note routed to Marissa Trevino, AGNP  y:   Urine culture sent Call or return to clinic prn if these symptoms worsen or fail to improve as anticipated. Follow-up: as needed   Marissa Trevino  02/21/2019 11:48 AM

## 2019-02-23 LAB — URINE CULTURE

## 2019-02-25 ENCOUNTER — Telehealth: Payer: Self-pay | Admitting: Adult Health

## 2019-02-25 MED ORDER — SULFAMETHOXAZOLE-TRIMETHOPRIM 800-160 MG PO TABS: 1.0000 | ORAL_TABLET | Freq: Two times a day (BID) | ORAL | 0 refills | Status: DC

## 2019-02-25 NOTE — Telephone Encounter (Signed)
Pt called and made aware urine culture + Ecoli, will rx septra ds to [facility]

## 2019-03-01 MED FILL — OMEPRAZOLE 40 MG CPDR: 40 | 90 days supply | Qty: 90 | Fill #0

## 2019-03-25 DIAGNOSIS — H52223 Regular astigmatism, bilateral: Secondary | ICD-10-CM | POA: Diagnosis not present

## 2019-04-29 ENCOUNTER — Other Ambulatory Visit (HOSPITAL_COMMUNITY): Payer: Self-pay | Admitting: Internal Medicine

## 2019-04-29 DIAGNOSIS — M25552 Pain in left hip: Secondary | ICD-10-CM | POA: Diagnosis not present

## 2019-04-29 DIAGNOSIS — M5432 Sciatica, left side: Secondary | ICD-10-CM | POA: Diagnosis not present

## 2019-04-29 MED FILL — OMEPRAZOLE 40 MG CPDR: 40 | 90 days supply | Qty: 90 | Fill #0

## 2019-04-30 ENCOUNTER — Other Ambulatory Visit (HOSPITAL_COMMUNITY): Payer: Self-pay | Admitting: Adult Health

## 2019-04-30 DIAGNOSIS — Z1231 Encounter for screening mammogram for malignant neoplasm of breast: Secondary | ICD-10-CM

## 2019-05-07 ENCOUNTER — Other Ambulatory Visit: Payer: Self-pay | Admitting: Adult Health

## 2019-05-07 MED ORDER — SULFAMETHOXAZOLE-TRIMETHOPRIM 800-160 MG PO TABS: 1.0000 | ORAL_TABLET | Freq: Two times a day (BID) | ORAL | 0 refills | Status: DC

## 2019-05-07 NOTE — Progress Notes (Signed)
rx septra ds

## 2019-05-08 ENCOUNTER — Other Ambulatory Visit: Payer: Self-pay

## 2019-05-08 ENCOUNTER — Ambulatory Visit (HOSPITAL_COMMUNITY)
Admission: RE | Admit: 2019-05-08 | Discharge: 2019-05-08 | Disposition: A | Discharge status: Home / Self Care | Payer: 59 | Source: Ambulatory Visit | Attending: Adult Health | Admitting: Adult Health

## 2019-05-08 DIAGNOSIS — Z1231 Encounter for screening mammogram for malignant neoplasm of breast: Secondary | ICD-10-CM | POA: Diagnosis not present

## 2019-05-13 ENCOUNTER — Other Ambulatory Visit: Payer: Self-pay | Admitting: Adult Health

## 2019-05-13 MED ORDER — ELMIRON 100 MG PO CAPS: 200.0000 mg | ORAL_CAPSULE | Freq: Two times a day (BID) | ORAL | 3 refills | Status: DC

## 2019-05-13 NOTE — Progress Notes (Signed)
Refill elmiron

## 2019-05-24 ENCOUNTER — Telehealth: Payer: Self-pay | Admitting: *Deleted

## 2019-05-24 MED FILL — ELMIRON 100 MG CAPSULE: 100 | 30 days supply | Qty: 90 | Fill #0

## 2019-05-24 NOTE — Telephone Encounter (Signed)
Dr. Elonda Husky approved Elmiron 100 mg capsule #90 TID with 3 refills. Prescription called in to [facility]. No PA needed. Pt aware. [place]

## 2019-06-08 ENCOUNTER — Ambulatory Visit
Admission: EM | Admit: 2019-06-08 | Discharge: 2019-06-08 | Disposition: A | Discharge status: Home / Self Care | Payer: 59

## 2019-06-08 ENCOUNTER — Other Ambulatory Visit: Payer: Self-pay

## 2019-06-08 DIAGNOSIS — R222 Localized swelling, mass and lump, trunk: Secondary | ICD-10-CM | POA: Diagnosis not present

## 2019-06-08 NOTE — ED Triage Notes (Signed)
Pt presents with hard knot under left breast that she noticed last night , has some back pain in left upper back

## 2019-06-08 NOTE — Discharge Instructions (Signed)
Declines x-ray at this time Concern for lipoma  Instructed patient to follow up with PCP this week for additional imaging Return or go to the ER if you have any new or worsening symptoms (fever, chills, chest pain, abdominal pain, changes in bowel or bladder habits, etc...)

## 2019-06-08 NOTE — ED Provider Notes (Signed)
[facility]   [id number] 06/08/19 Arrival Time: 0908  CC: LT sided mass  SUBJECTIVE: History from: patient. Marissa Trevino is a 44 y.o. female complains of LT sided abdominal mass that she noticed last night.  Denies a precipitating event or specific injury.  Localizes symptoms to LT upper abdomen of rib cage.  Denies pain.  Denies alleviating or aggravating factors.  Denies similar symptoms in the past.  Denies fever, chills, erythema, weight-loss, nausea, vomiting, breast changes (lumps, bumps, discharge, nipple retraction), axillary LAD, dysuria, flank pain.    ROS: As per HPI.  All other pertinent ROS negative.     Past Medical History:  Diagnosis Date  . Abnormal Pap smear of cervix 02/29/2016   LSIL, well get colpo  . Abnormal uterine bleeding (AUB) 09/15/2014  . Acid reflux   . Arthritis   . BV (bacterial vaginosis) 05/02/2012  . Cancer ([place])    skin  . Chronic kidney disease    kidney stones  . Fibroadenoma of left breast 11/03/2014   Had bx 10/28/14  . Hematuria 12/18/2012  . Herpes simplex without mention of complication   . History of kidney stones 11/14/2014  . IC (interstitial cystitis) 02/23/2015  . Interstitial cystitis   . Pelvic pain in female 09/15/2014  . PONV (postoperative nausea and vomiting)   . Trichimoniasis 12/10/2014  . Urinary frequency 11/14/2014  . UTI (lower urinary tract infection) 12/18/2012  . Vaginal discharge 12/10/2014  . Vaginal irritation 12/10/2014   Past Surgical History:  Procedure Laterality Date  . CESAREAN SECTION    . CHOLECYSTECTOMY  16 years ago   [facility]  . CYSTOSCOPY WITH RETROGRADE PYELOGRAM, URETEROSCOPY AND STENT PLACEMENT Left 10/07/2014   Procedure: CYSTOSCOPY WITH LEFT RETROGRADE PYELOGRAM, URETEROSCOPY AND LEFT STENT PLACEMENT;  Surgeon: Cleon Gustin, MD;  Location: [facility];  Service: Urology;  Laterality: Left;  . DILITATION & CURRETTAGE/HYSTROSCOPY WITH THERMACHOICE ABLATION N/A 03/09/2012   Procedure:  DILATATION & CURETTAGE/HYSTEROSCOPY WITH THERMACHOICE ABLATION;  Surgeon: Florian Buff, MD;  Location: [facility];  Service: Gynecology;  Laterality: N/A;  D5W in -1ml,out 29ml, Total therapy time:26min 39sec   . LITHOTRIPSY  10/09/14  . LITHOTRIPSY  12/2015  . TONSILLECTOMY AND ADENOIDECTOMY    . TUBAL LIGATION     Allergies  Allergen Reactions  . Codeine Nausea And Vomiting   No current facility-administered medications on file prior to encounter.   Current Outpatient Medications on File Prior to Encounter  Medication Sig Dispense Refill  . BIOTIN PO Take 1 capsule by mouth daily as needed.    . cetirizine (ZYRTEC) 10 MG tablet Take 10 mg by mouth daily as needed for allergies.    Marland Kitchen ELMIRON 100 MG capsule Take 2 capsules (200 mg total) by mouth 2 (two) times daily. 120 capsule 3  . escitalopram (LEXAPRO) 10 MG tablet Take 10 mg by mouth daily.  1  . fluconazole (DIFLUCAN) 150 MG tablet Take 1 now and 1 in 3 days 2 tablet 3  . Meth-Hyo-M Bl-Na Phos-Ph Sal (URIBEL) 118 MG CAPS Take 118 mg by mouth 3 (three) times daily as needed (for urinary frequency).    . mirabegron ER (MYRBETRIQ) 50 MG TB24 tablet Take 50 mg by mouth daily as needed (for urinary frequency).    Marland Kitchen omeprazole (PRILOSEC) 40 MG capsule Take by mouth daily.  4  . potassium chloride SA (K-DUR,KLOR-CON) 20 MEQ tablet TAKE 1 TABLET BY MOUTH DAILY. 90 tablet 3  . sulfamethoxazole-trimethoprim (BACTRIM DS) 800-160  MG tablet Take 1 tablet by mouth 2 (two) times daily. Take 1 bid 14 tablet 0  . valACYclovir (VALTREX) 1000 MG tablet TAKE 1 TABLET BY MOUTH DAILY 90 tablet 3   Social History   Socioeconomic History  . Marital status: Single    Spouse name: Not on file  . Number of children: Not on file  . Years of education: Not on file  . Highest education level: Not on file  Occupational History  . Occupation: Chartered certified accountant    Comment: the [employer]  Tobacco Use  . Smoking status: Never Smoker  . Smokeless tobacco: Never  Used  Substance and Sexual Activity  . Alcohol use: No    Alcohol/week: 0.0 standard drinks  . Drug use: No  . Sexual activity: Yes    Birth control/protection: Surgical    Comment: tubal  Other Topics Concern  . Not on file  Social History Narrative  . Not on file   Social Determinants of Health   Financial Resource Strain:   . Difficulty of Paying Living Expenses:   Food Insecurity:   . Worried About Charity fundraiser in the Last Year:   . Arboriculturist in the Last Year:   Transportation Needs:   . Film/video editor (Medical):   Marland Kitchen Lack of Transportation (Non-Medical):   Physical Activity:   . Days of Exercise per Week:   . Minutes of Exercise per Session:   Stress:   . Feeling of Stress :   Social Connections:   . Frequency of Communication with Friends and Family:   . Frequency of Social Gatherings with Friends and Family:   . Attends Religious Services:   . Active Member of Clubs or Organizations:   . Attends Archivist Meetings:   Marland Kitchen Marital Status:   Intimate Partner Violence:   . Fear of Current or Ex-Partner:   . Emotionally Abused:   Marland Kitchen Physically Abused:   . Sexually Abused:    Family History  Problem Relation Age of Onset  . Hypertension Mother   . Heart disease Mother   . Hypertension Father   . Heart disease Father   . Heart attack Father   . Lung cancer Father   . Cancer Father   . Cancer Maternal Grandmother        breast  . Diabetes Paternal Grandmother   . Cancer Paternal Grandfather        lung  . Heart attack Brother   . Hypertension Brother   . Hyperlipidemia Brother   . Other Brother        brain tumor  . Colon cancer Neg Hx     OBJECTIVE:  Vitals:   06/08/19 0925  BP: 134/85  Pulse: 80  Resp: 18  Temp: 98.5 F (36.9 C)  SpO2: 97%    General appearance: ALERT; in no acute distress.  Head: NCAT Lungs: Normal respiratory effort; CTAB CV: RRR Trunk: 5-6 x 3-4 cm fatty mass over anterior LT lower ribs,  NTTP, freely moveable, no surrounding erythema, bleeding or drainage Skin: warm and dry Neurologic: Ambulates without difficulty Psychological: alert and cooperative; normal mood and affect   ASSESSMENT & PLAN:  1. Mass of chest wall, left     Declines x-ray at this time Concern for lipoma  Instructed patient to follow up with PCP this week for additional imaging Return or go to the ER if you have any new or worsening symptoms (fever, chills, chest pain,  abdominal pain, changes in bowel or bladder habits, etc...)   Reviewed expectations re: course of current medical issues. Questions answered. Outlined signs and symptoms indicating need for more acute intervention. Patient verbalized understanding. After Visit Summary given.    Lestine Box, PA-C 06/08/19 [phone]

## 2019-06-11 ENCOUNTER — Other Ambulatory Visit: Payer: 59 | Admitting: Adult Health

## 2019-07-16 ENCOUNTER — Ambulatory Visit (INDEPENDENT_AMBULATORY_CARE_PROVIDER_SITE_OTHER): Payer: 59 | Admitting: Adult Health

## 2019-07-16 ENCOUNTER — Encounter: Payer: Self-pay | Admitting: Adult Health

## 2019-07-16 VITALS — BP 143/92 | HR 71 | Ht 63.0 in | Wt 184.0 lb

## 2019-07-16 DIAGNOSIS — Z1212 Encounter for screening for malignant neoplasm of rectum: Secondary | ICD-10-CM | POA: Diagnosis not present

## 2019-07-16 DIAGNOSIS — Z1211 Encounter for screening for malignant neoplasm of colon: Secondary | ICD-10-CM

## 2019-07-16 DIAGNOSIS — Z01419 Encounter for gynecological examination (general) (routine) without abnormal findings: Secondary | ICD-10-CM | POA: Diagnosis not present

## 2019-07-16 LAB — HEMOCCULT GUIAC POC 1CARD (OFFICE): Fecal Occult Blood, POC: NEGATIVE

## 2019-07-16 NOTE — Progress Notes (Signed)
Patient ID: Marissa Trevino, female   DOB: 11-Jan-1976, 44 y.o.   MRN: [id number] History of Present Illness: Marissa Trevino is a 44 year old white female, single, G3P3 in for a well woman gyn exam, she had a normal pap with negative HPV 05/29/17. 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  PCP is Dr Nevada Crane.   Current Medications, Allergies, Past Medical History, Past Surgical History, Family History and Social History were reviewed in [employer] record.     Review of Systems: Patient denies any headaches, hearing loss, fatigue, blurred vision, shortness of breath, chest pain, abdominal pain, problems with bowel movements, urination, or intercourse. No joint pain or mood swings. Has had area left ribs that looked swollen better now, has lost some weight She has had a tubal ligation.  Physical Exam:BP (!) 143/92 (BP Location: Left Arm, Patient Position: Sitting, Cuff Size: Normal)   Pulse 71   Ht 5\' 3"  (1.6 m)   Wt 184 lb (83.5 kg)   BMI 32.59 kg/m  General:  Well developed, well nourished, no acute distress Skin:  Warm and dry Neck:  Midline trachea, normal thyroid, good ROM, no lymphadenopathy Lungs; Clear to auscultation bilaterally Breast:  No dominant palpable mass, retraction, or nipple discharge Cardiovascular: Regular rate and rhythm Abdomen:  Soft, non tender, no hepatosplenomegaly Pelvic:  External genitalia is normal in appearance, no lesions.  The vagina is normal in appearance. Urethra has no lesions or masses. The cervix is bulbous.  Uterus is felt to be normal size, shape, and contour.  No adnexal masses or tenderness noted.Bladder is non tender, no masses felt. Rectal: Good sphincter tone, no polyps, or hemorrhoids felt.  Hemoccult negative. Extremities/musculoskeletal:  No swelling or varicosities noted, no clubbing or cyanosis Psych:  No mood changes, alert and cooperative,seems happy AA is 0 Fall risk is low PHQ 9 score is 3.   Examination chaperoned by Celene Squibb LPN.   Impression and Plan: 1. Encounter for well woman exam with routine gynecological exam Pap and physical in 1 year Mammogram yearly Labs per PCP  2. Screening for colorectal cancer Colonoscopy advised between 45-50

## 2019-07-26 MED FILL — valACYclovir HCL 1 GM TABS: 1 | 90 days supply | Qty: 90 | Fill #1

## 2019-09-06 ENCOUNTER — Other Ambulatory Visit: Payer: Self-pay

## 2019-09-06 ENCOUNTER — Encounter (HOSPITAL_COMMUNITY): Payer: Self-pay | Admitting: *Deleted

## 2019-09-06 ENCOUNTER — Emergency Department (HOSPITAL_COMMUNITY)
Admission: EM | Admit: 2019-09-06 | Discharge: 2019-09-06 | Disposition: A | Discharge status: Home / Self Care | Payer: 59 | Attending: Emergency Medicine | Admitting: Emergency Medicine

## 2019-09-06 DIAGNOSIS — N189 Chronic kidney disease, unspecified: Secondary | ICD-10-CM | POA: Diagnosis not present

## 2019-09-06 DIAGNOSIS — Z79899 Other long term (current) drug therapy: Secondary | ICD-10-CM | POA: Insufficient documentation

## 2019-09-06 DIAGNOSIS — Z85828 Personal history of other malignant neoplasm of skin: Secondary | ICD-10-CM | POA: Insufficient documentation

## 2019-09-06 DIAGNOSIS — T50Z95A Adverse effect of other vaccines and biological substances, initial encounter: Secondary | ICD-10-CM | POA: Diagnosis not present

## 2019-09-06 DIAGNOSIS — Z85038 Personal history of other malignant neoplasm of large intestine: Secondary | ICD-10-CM | POA: Diagnosis not present

## 2019-09-06 DIAGNOSIS — T50B95A Adverse effect of other viral vaccines, initial encounter: Secondary | ICD-10-CM | POA: Insufficient documentation

## 2019-09-06 DIAGNOSIS — R000 Tachycardia, unspecified: Secondary | ICD-10-CM | POA: Insufficient documentation

## 2019-09-06 DIAGNOSIS — T7840XA Allergy, unspecified, initial encounter: Secondary | ICD-10-CM | POA: Insufficient documentation

## 2019-09-06 DIAGNOSIS — M791 Myalgia, unspecified site: Secondary | ICD-10-CM | POA: Diagnosis not present

## 2019-09-06 DIAGNOSIS — R05 Cough: Secondary | ICD-10-CM | POA: Diagnosis not present

## 2019-09-06 DIAGNOSIS — R509 Fever, unspecified: Secondary | ICD-10-CM | POA: Diagnosis not present

## 2019-09-06 DIAGNOSIS — R9431 Abnormal electrocardiogram [ECG] [EKG]: Secondary | ICD-10-CM | POA: Diagnosis not present

## 2019-09-06 MED ORDER — ACETAMINOPHEN 325 MG PO TABS
650.0000 mg | ORAL_TABLET | Freq: Once | ORAL | Status: AC
  Administered 2019-09-06: 650 mg via ORAL
  Filled 2019-09-06: qty 2

## 2019-09-06 NOTE — ED Provider Notes (Signed)
[facility] EMERGENCY DEPARTMENT Provider Note   CSN: [id number] Arrival date & time: 09/06/19  1037     History Chief Complaint  Patient presents with  . Allergic Reaction    Marissa Trevino is a 44 y.o. female.  Pt had covid [employer] vaccine yesterday.  Pt complains of having a fever, chills and body aches today.  Pt took tylenol at 5;30am but nothing since.  Pt complains of feeling weak.  Pt ate a biscuit but has had limited fluid intake  The history is provided by the patient. No language interpreter was used.  Allergic Reaction Relieved by:  Nothing Worsened by:  Nothing Ineffective treatments:  None tried      Past Medical History:  Diagnosis Date  . Abnormal Pap smear of cervix 02/29/2016   LSIL, well get colpo  . Abnormal uterine bleeding (AUB) 09/15/2014  . Acid reflux   . Arthritis   . BV (bacterial vaginosis) 05/02/2012  . Cancer ([place])    skin  . Chronic kidney disease    kidney stones  . Fibroadenoma of left breast 11/03/2014   Had bx 10/28/14  . Hematuria 12/18/2012  . Herpes simplex without mention of complication   . History of kidney stones 11/14/2014  . IC (interstitial cystitis) 02/23/2015  . Interstitial cystitis   . Pelvic pain in female 09/15/2014  . PONV (postoperative nausea and vomiting)   . Trichimoniasis 12/10/2014  . Urinary frequency 11/14/2014  . UTI (lower urinary tract infection) 12/18/2012  . Vaginal discharge 12/10/2014  . Vaginal irritation 12/10/2014    Patient Active Problem List   Diagnosis Date Noted  . Encounter for well woman exam with routine gynecological exam 07/16/2019  . Screening for colorectal cancer 05/29/2017  . Abnormal Pap smear of cervix 02/29/2016  . History of abnormal cervical Pap smear 02/24/2016  . Encounter for gynecological examination with Papanicolaou smear of cervix 02/24/2016  . IC (interstitial cystitis) 02/23/2015  . Vaginal irritation 12/10/2014  . Vaginal discharge 12/10/2014  .  Trichimoniasis 12/10/2014  . Urinary frequency 11/14/2014  . History of kidney stones 11/14/2014  . Fibroadenoma of left breast 11/03/2014  . Renal colic on left side 61/44/3154  . Pelvic pain in female 09/15/2014  . Abnormal uterine bleeding (AUB) 09/15/2014  . Chronic interstitial cystitis 01/31/2013  . Hematuria 12/18/2012  . UTI (lower urinary tract infection) 12/18/2012  . Retroversion, uterus 05/24/2012  . BV (bacterial vaginosis) 05/02/2012  . DERMATITIS, POISON OAK 07/09/2008  . CANDIDIASIS, VAGINAL 05/30/2008  . DYSURIA 05/30/2008  . BRONCHITIS, ACUTE WITH BRONCHOSPASM 10/10/2007  . SCIATICA 02/01/2007  . GERD 02/27/2006  . Constipation 02/27/2006  . SKIN LESION 02/27/2006    Past Surgical History:  Procedure Laterality Date  . CESAREAN SECTION    . CHOLECYSTECTOMY  16 years ago   [facility]  . CYSTOSCOPY WITH RETROGRADE PYELOGRAM, URETEROSCOPY AND STENT PLACEMENT Left 10/07/2014   Procedure: CYSTOSCOPY WITH LEFT RETROGRADE PYELOGRAM, URETEROSCOPY AND LEFT STENT PLACEMENT;  Surgeon: Cleon Gustin, MD;  Location: [facility];  Service: Urology;  Laterality: Left;  . DILITATION & CURRETTAGE/HYSTROSCOPY WITH THERMACHOICE ABLATION N/A 03/09/2012   Procedure: DILATATION & CURETTAGE/HYSTEROSCOPY WITH THERMACHOICE ABLATION;  Surgeon: Florian Buff, MD;  Location: [facility];  Service: Gynecology;  Laterality: N/A;  D5W in -437m,out 366m Total therapy time:37m60m39sec   . LITHOTRIPSY  10/09/14  . LITHOTRIPSY  12/2015  . TONSILLECTOMY AND ADENOIDECTOMY    . TUBAL LIGATION       OB History  Gravida  3   Para  3   Term      Preterm      AB      Living  3     SAB      TAB      Ectopic      Multiple      Live Births  3           Family History  Problem Relation Age of Onset  . Hypertension Mother   . Heart disease Mother   . Hypertension Father   . Heart disease Father   . Heart attack Father   . Lung cancer Father   . Cancer Father   . Cancer Maternal  Grandmother        breast  . Diabetes Paternal Grandmother   . Cancer Paternal Grandfather        lung  . Heart attack Brother   . Hypertension Brother   . Hyperlipidemia Brother   . Other Brother        brain tumor  . Colon cancer Neg Hx     Social History   Tobacco Use  . Smoking status: Never Smoker  . Smokeless tobacco: Never Used  Vaping Use  . Vaping Use: Never used  Substance Use Topics  . Alcohol use: No    Alcohol/week: 0.0 standard drinks  . Drug use: No    Home Medications Prior to Admission medications   Medication Sig Start Date End Date Taking? Authorizing Provider  BIOTIN PO Take 1 capsule by mouth daily as needed.    [provider]  cetirizine (ZYRTEC) 10 MG tablet Take 10 mg by mouth daily as needed for allergies.    [provider]  ELMIRON 100 MG capsule Take 2 capsules (200 mg total) by mouth 2 (two) times daily. 05/13/19   Estill Dooms, NP  omeprazole (PRILOSEC) 40 MG capsule Take by mouth daily. 02/20/17   [provider]  potassium chloride SA (K-DUR,KLOR-CON) 20 MEQ tablet TAKE 1 TABLET BY MOUTH DAILY. 04/03/18   Herminio Commons, MD  valACYclovir (VALTREX) 1000 MG tablet TAKE 1 TABLET BY MOUTH DAILY 08/27/18   Derrek Monaco A, NP  escitalopram (LEXAPRO) 10 MG tablet Take 10 mg by mouth daily. Patient not taking: Reported on 07/16/2019 03/24/17 09/06/19  [provider]  mirabegron ER (MYRBETRIQ) 50 MG TB24 tablet Take 50 mg by mouth daily as needed (for urinary frequency). Patient not taking: Reported on 07/16/2019  09/06/19  [provider]    Allergies    Codeine  Review of Systems   Review of Systems  Constitutional: Positive for chills, fatigue and fever.  Musculoskeletal: Positive for myalgias.  All other systems reviewed and are negative.   Physical Exam Updated Vital Signs BP 133/84 (BP Location: Right Arm)   Pulse (!) 130   Temp 98.7 F (37.1 C) (Oral)   Resp 18   Ht '5\' 3"'  (1.6  m)   Wt 86.2 kg   SpO2 98%   BMI 33.66 kg/m   Physical Exam Vitals and nursing note reviewed.  Constitutional:      Appearance: She is well-developed.  HENT:     Head: Normocephalic.  Cardiovascular:     Rate and Rhythm: Tachycardia present.  Pulmonary:     Effort: Pulmonary effort is normal.  Abdominal:     General: There is no distension.  Musculoskeletal:        General: Normal range of motion.  Cervical back: Normal range of motion.  Neurological:     Mental Status: She is alert and oriented to person, place, and time.  Psychiatric:        Mood and Affect: Mood normal.     ED Results / Procedures / Treatments   Labs (all labs ordered are listed, but only abnormal results are displayed) Labs Reviewed - No data to display  EKG None  Radiology No results found.  Procedures Procedures (including critical care time)  Medications Ordered in ED Medications  acetaminophen (TYLENOL) tablet 650 mg (650 mg Oral Given 09/06/19 1153)    ED Course  I have reviewed the triage vital signs and the nursing notes.  Pertinent labs & imaging results that were available during my care of the patient were reviewed by me and considered in my medical decision making (see chart for details).    MDM Rules/Calculators/A&P                          MDM:  Pt given tylenol, encouraged to drink fluids.  Pt seems to be having post vaccine symptoms which are common.  Pt is advised to ret today and tommorow.  Recheck if symptoms worsen or do not resolve.  Final Clinical Impression(s) / ED Diagnoses Final diagnoses:  Adverse effect of vaccine, initial encounter    Rx / DC Orders ED Discharge Orders    None    An After Visit Summary was printed and given to the patient.    Sidney Ace 09/06/19 1206    Milton Ferguson, MD 09/08/19 1246

## 2019-09-06 NOTE — Discharge Instructions (Addendum)
Tylenol every 4 hours for the next 24 hours.  Drink plenty of fluids

## 2019-09-06 NOTE — ED Triage Notes (Signed)
States she took the covid shot yesterday and today has fever, chills, body aches. States she took the [employer] vaccine.

## 2019-10-01 MED FILL — OMEPRAZOLE 40 MG CPDR: 40 | 90 days supply | Qty: 90 | Fill #1

## 2019-11-05 DIAGNOSIS — E876 Hypokalemia: Secondary | ICD-10-CM | POA: Diagnosis not present

## 2019-11-05 DIAGNOSIS — M25512 Pain in left shoulder: Secondary | ICD-10-CM | POA: Diagnosis not present

## 2019-11-05 DIAGNOSIS — M5432 Sciatica, left side: Secondary | ICD-10-CM | POA: Diagnosis not present

## 2019-11-05 DIAGNOSIS — R7301 Impaired fasting glucose: Secondary | ICD-10-CM | POA: Diagnosis not present

## 2019-11-05 DIAGNOSIS — M25552 Pain in left hip: Secondary | ICD-10-CM | POA: Diagnosis not present

## 2019-11-05 DIAGNOSIS — E782 Mixed hyperlipidemia: Secondary | ICD-10-CM | POA: Diagnosis not present

## 2019-11-05 DIAGNOSIS — N3010 Interstitial cystitis (chronic) without hematuria: Secondary | ICD-10-CM | POA: Diagnosis not present

## 2019-11-05 DIAGNOSIS — Z6834 Body mass index (BMI) 34.0-34.9, adult: Secondary | ICD-10-CM | POA: Diagnosis not present

## 2019-11-05 DIAGNOSIS — M5431 Sciatica, right side: Secondary | ICD-10-CM | POA: Diagnosis not present

## 2019-12-11 DIAGNOSIS — C44619 Basal cell carcinoma of skin of left upper limb, including shoulder: Secondary | ICD-10-CM | POA: Diagnosis not present

## 2019-12-11 DIAGNOSIS — D485 Neoplasm of uncertain behavior of skin: Secondary | ICD-10-CM | POA: Diagnosis not present

## 2019-12-11 DIAGNOSIS — D225 Melanocytic nevi of trunk: Secondary | ICD-10-CM | POA: Diagnosis not present

## 2019-12-11 DIAGNOSIS — L650 Telogen effluvium: Secondary | ICD-10-CM | POA: Diagnosis not present

## 2019-12-11 DIAGNOSIS — Z1283 Encounter for screening for malignant neoplasm of skin: Secondary | ICD-10-CM | POA: Diagnosis not present

## 2019-12-11 DIAGNOSIS — C44519 Basal cell carcinoma of skin of other part of trunk: Secondary | ICD-10-CM | POA: Diagnosis not present

## 2019-12-24 DIAGNOSIS — L988 Other specified disorders of the skin and subcutaneous tissue: Secondary | ICD-10-CM | POA: Diagnosis not present

## 2019-12-24 DIAGNOSIS — D485 Neoplasm of uncertain behavior of skin: Secondary | ICD-10-CM | POA: Diagnosis not present

## 2020-01-28 DIAGNOSIS — Z85828 Personal history of other malignant neoplasm of skin: Secondary | ICD-10-CM | POA: Diagnosis not present

## 2020-01-28 DIAGNOSIS — Z08 Encounter for follow-up examination after completed treatment for malignant neoplasm: Secondary | ICD-10-CM | POA: Diagnosis not present

## 2020-01-28 DIAGNOSIS — L570 Actinic keratosis: Secondary | ICD-10-CM | POA: Diagnosis not present

## 2020-01-28 DIAGNOSIS — X32XXXD Exposure to sunlight, subsequent encounter: Secondary | ICD-10-CM | POA: Diagnosis not present

## 2020-02-28 ENCOUNTER — Other Ambulatory Visit: Payer: Self-pay | Admitting: Adult Health

## 2020-02-28 MED FILL — valACYclovir HCL 1 GM TABS: 1 | 90 days supply | Qty: 90 | Fill #0

## 2020-02-28 MED FILL — OMEPRAZOLE 40 MG CPDR: 40 | 90 days supply | Qty: 90 | Fill #2

## 2020-04-30 ENCOUNTER — Other Ambulatory Visit (HOSPITAL_COMMUNITY): Payer: Self-pay | Admitting: Adult Health

## 2020-04-30 DIAGNOSIS — Z1231 Encounter for screening mammogram for malignant neoplasm of breast: Secondary | ICD-10-CM

## 2020-05-05 ENCOUNTER — Other Ambulatory Visit (HOSPITAL_COMMUNITY): Payer: Self-pay

## 2020-05-06 ENCOUNTER — Ambulatory Visit (HOSPITAL_COMMUNITY): Payer: 59

## 2020-05-08 ENCOUNTER — Ambulatory Visit (HOSPITAL_COMMUNITY)
Admission: RE | Admit: 2020-05-08 | Discharge: 2020-05-08 | Disposition: A | Discharge status: Home / Self Care | Payer: 59 | Source: Ambulatory Visit | Attending: Adult Health | Admitting: Adult Health

## 2020-05-08 DIAGNOSIS — Z1231 Encounter for screening mammogram for malignant neoplasm of breast: Secondary | ICD-10-CM | POA: Insufficient documentation

## 2020-05-26 DIAGNOSIS — Z85828 Personal history of other malignant neoplasm of skin: Secondary | ICD-10-CM | POA: Diagnosis not present

## 2020-05-26 DIAGNOSIS — C44519 Basal cell carcinoma of skin of other part of trunk: Secondary | ICD-10-CM | POA: Diagnosis not present

## 2020-05-26 DIAGNOSIS — L570 Actinic keratosis: Secondary | ICD-10-CM | POA: Diagnosis not present

## 2020-05-26 DIAGNOSIS — D485 Neoplasm of uncertain behavior of skin: Secondary | ICD-10-CM | POA: Diagnosis not present

## 2020-05-26 DIAGNOSIS — D225 Melanocytic nevi of trunk: Secondary | ICD-10-CM | POA: Diagnosis not present

## 2020-05-26 DIAGNOSIS — X32XXXD Exposure to sunlight, subsequent encounter: Secondary | ICD-10-CM | POA: Diagnosis not present

## 2020-05-26 DIAGNOSIS — Z08 Encounter for follow-up examination after completed treatment for malignant neoplasm: Secondary | ICD-10-CM | POA: Diagnosis not present

## 2020-06-23 DIAGNOSIS — Z08 Encounter for follow-up examination after completed treatment for malignant neoplasm: Secondary | ICD-10-CM | POA: Diagnosis not present

## 2020-06-23 DIAGNOSIS — D485 Neoplasm of uncertain behavior of skin: Secondary | ICD-10-CM | POA: Diagnosis not present

## 2020-06-23 DIAGNOSIS — Z85828 Personal history of other malignant neoplasm of skin: Secondary | ICD-10-CM | POA: Diagnosis not present

## 2020-06-23 DIAGNOSIS — L988 Other specified disorders of the skin and subcutaneous tissue: Secondary | ICD-10-CM | POA: Diagnosis not present

## 2020-08-13 ENCOUNTER — Other Ambulatory Visit (HOSPITAL_COMMUNITY): Payer: Self-pay

## 2020-08-13 ENCOUNTER — Other Ambulatory Visit: Payer: Self-pay | Admitting: Adult Health

## 2020-08-13 MED ORDER — ELMIRON 100 MG PO CAPS
200.0000 mg | ORAL_CAPSULE | Freq: Two times a day (BID) | ORAL | 3 refills | Status: DC
  Filled 2020-08-13 – 2020-08-18 (×2): qty 120, 30d supply, fill #0

## 2020-08-13 NOTE — Telephone Encounter (Signed)
Pt asked if we could see if Dr. Elonda Husky would refill this for her since Anderson Malta is not in the office this seek  [place]  Please advise & notify pt

## 2020-08-14 ENCOUNTER — Other Ambulatory Visit (HOSPITAL_COMMUNITY): Payer: Self-pay

## 2020-08-18 ENCOUNTER — Other Ambulatory Visit (HOSPITAL_COMMUNITY): Payer: Self-pay

## 2020-08-20 ENCOUNTER — Other Ambulatory Visit (HOSPITAL_COMMUNITY): Payer: Self-pay

## 2020-08-20 ENCOUNTER — Telehealth: Payer: Self-pay | Admitting: Obstetrics & Gynecology

## 2020-08-20 NOTE — Telephone Encounter (Signed)
Left message @ 4:53 pm. JSY

## 2020-08-20 NOTE — Telephone Encounter (Signed)
Patient called stating that she had a medication send to the pharmacy for a refill but the pharmacy states that her insurance has denied it. Patient would like a call back from either Dr. Elonda Husky or his nurse. Please contact pt.

## 2020-08-21 NOTE — Telephone Encounter (Signed)
Spoke with pt. Pt advised this med may be requiring a PA. Please review and advise pt. Thanks!! [place]

## 2020-08-24 ENCOUNTER — Other Ambulatory Visit: Payer: Self-pay | Admitting: Obstetrics & Gynecology

## 2020-08-24 ENCOUNTER — Telehealth: Payer: Self-pay

## 2020-08-24 ENCOUNTER — Other Ambulatory Visit (HOSPITAL_COMMUNITY): Payer: Self-pay

## 2020-08-24 MED ORDER — ELMIRON 100 MG PO CAPS
ORAL_CAPSULE | ORAL | 11 refills | Status: DC
  Filled 2020-08-24: qty 90, 30d supply, fill #0

## 2020-08-24 NOTE — Telephone Encounter (Signed)
Called pt to inform her of prescription change. Pt stated that she had received a text from [facility] and needed the med sent to [facility]. Called to get med switched.

## 2020-08-29 ENCOUNTER — Telehealth: Payer: 59 | Admitting: Nurse Practitioner

## 2020-08-29 DIAGNOSIS — N3090 Cystitis, unspecified without hematuria: Secondary | ICD-10-CM

## 2020-08-29 MED ORDER — NITROFURANTOIN MONOHYD MACRO 100 MG PO CAPS: 100.0000 mg | ORAL_CAPSULE | Freq: Two times a day (BID) | ORAL | 0 refills | Status: AC

## 2020-08-29 NOTE — Progress Notes (Signed)
Virtual Visit Consent   Marissa Trevino, you are scheduled for a virtual visit with a [facility] provider today.     Just as with appointments in the office, your consent must be obtained to participate.  Your consent will be active for this visit and any virtual visit you may have with one of our providers in the next 365 days.     If you have a MyChart account, a copy of this consent can be sent to you electronically.  All virtual visits are billed to your insurance company just like a traditional visit in the office.    As this is a virtual visit, video technology does not allow for your provider to perform a traditional examination.  This may limit your provider's ability to fully assess your condition.  If your provider identifies any concerns that need to be evaluated in person or the need to arrange testing (such as labs, EKG, etc.), we will make arrangements to do so.     Although advances in technology are sophisticated, we cannot ensure that it will always work on either your end or our end.  If the connection with a video visit is poor, the visit may have to be switched to a telephone visit.  With either a video or telephone visit, we are not always able to ensure that we have a secure connection.     I need to obtain your verbal consent now.   Are you willing to proceed with your visit today?    Marissa Trevino has provided verbal consent on 08/29/2020 for a virtual visit (video or telephone).   Marissa Schneiders, FNP   Date: 08/29/2020 8:41 AM   Virtual Visit via Video Note   I, Marissa Trevino, connected with  Marissa Trevino  ([id number][birth year]) on 08/29/20 at  9:15 AM EDT by a video-enabled telemedicine application and verified that I am speaking with the correct person using two identifiers.  Location: Patient: Virtual Visit Location Patient: Home Provider: Virtual Visit Location Provider: Office/Clinic   I discussed the limitations of evaluation and management by telemedicine and  the availability of in person appointments. The patient expressed understanding and agreed to proceed.    History of Present Illness: Marissa Trevino is a 45 y.o. who identifies as a female who was assigned female at birth, and is being seen today with symptoms of a UTI. She has pain when urinating. She has also had urinary frequency and urgency. She has tried increasing her water intake without relief.   She denies back pain N/V or fever.  Denies complicated UTIs in the past or any problem with antibiotic use   Problems:  Patient Active Problem List   Diagnosis Date Noted   Encounter for well woman exam with routine gynecological exam 07/16/2019   Screening for colorectal cancer 05/29/2017   Abnormal Pap smear of cervix 02/29/2016   History of abnormal cervical Pap smear 02/24/2016   Encounter for gynecological examination with Papanicolaou smear of cervix 02/24/2016   IC (interstitial cystitis) 02/23/2015   Vaginal irritation 12/10/2014   Vaginal discharge 12/10/2014   Trichimoniasis 12/10/2014   Urinary frequency 11/14/2014   History of kidney stones 11/14/2014   Fibroadenoma of left breast [id number]   Renal colic on left side [id number]   Pelvic pain in female 09/15/2014   Abnormal uterine bleeding (AUB) 09/15/2014   Chronic interstitial cystitis 01/31/2013   Hematuria 12/18/2012   UTI (lower urinary tract infection) 12/18/2012   Retroversion,  uterus 05/24/2012   BV (bacterial vaginosis) 05/02/2012   DERMATITIS, POISON OAK 07/09/2008   CANDIDIASIS, VAGINAL 05/30/2008   DYSURIA 05/30/2008   BRONCHITIS, ACUTE WITH BRONCHOSPASM 10/10/2007   SCIATICA 02/01/2007   GERD 02/27/2006   Constipation 02/27/2006   SKIN LESION 02/27/2006    Allergies:  Allergies  Allergen Reactions   Codeine Nausea And Vomiting   Medications:  Current Outpatient Medications:    BIOTIN PO, Take 1 capsule by mouth daily as needed., Disp: , Rfl:    cetirizine (ZYRTEC) 10 MG tablet, Take 10 mg by  mouth daily as needed for allergies., Disp: , Rfl:    ELMIRON 100 MG capsule, Take 1 capsule by mouth in the morning and 2 capsules in the evening., Disp: 90 capsule, Rfl: 11   omeprazole (PRILOSEC) 40 MG capsule, Take by mouth daily., Disp: , Rfl: 4   omeprazole (PRILOSEC) 40 MG capsule, TAKE 1 CAPSULE BY MOUTH ONCE DAILY FOR REFLUX., Disp: 90 capsule, Rfl: 3   potassium chloride SA (K-DUR,KLOR-CON) 20 MEQ tablet, TAKE 1 TABLET BY MOUTH DAILY., Disp: 90 tablet, Rfl: 3   valACYclovir (VALTREX) 1000 MG tablet, TAKE 1 TABLET BY MOUTH DAILY, Disp: 90 tablet, Rfl: 3  Observations/Objective: Patient is well-developed, well-nourished in no acute distress.  Resting comfortably at home.  Head is normocephalic, atraumatic.  No labored breathing.  Speech is clear and coherent with logical content.  Patient is alert and oriented at baseline.    Assessment and Plan: 1. Cystitis  - nitrofurantoin, macrocrystal-monohydrate, (MACROBID) 100 MG capsule; Take 1 capsule (100 mg total) by mouth 2 (two) times daily for 7 days.  Dispense: 14 capsule; Refill: 0    Increase water intake, avoid alcohol sugar and caffeine beverages.   Follow Up Instructions: I discussed the assessment and treatment plan with the patient. The patient was provided an opportunity to ask questions and all were answered. The patient agreed with the plan and demonstrated an understanding of the instructions.  A copy of instructions were sent to the patient via MyChart.  The patient was advised to call back or seek an in-person evaluation if the symptoms worsen or if the condition fails to improve as anticipated.  Time:  I spent 15 minutes with the patient via telehealth technology discussing the above problems/concerns.    Marissa Schneiders, FNP

## 2020-09-07 DIAGNOSIS — R300 Dysuria: Secondary | ICD-10-CM | POA: Diagnosis not present

## 2020-09-07 DIAGNOSIS — R3915 Urgency of urination: Secondary | ICD-10-CM | POA: Diagnosis not present

## 2020-09-07 DIAGNOSIS — N201 Calculus of ureter: Secondary | ICD-10-CM | POA: Diagnosis not present

## 2020-09-07 DIAGNOSIS — R8271 Bacteriuria: Secondary | ICD-10-CM | POA: Diagnosis not present

## 2020-09-24 DIAGNOSIS — N201 Calculus of ureter: Secondary | ICD-10-CM | POA: Diagnosis not present

## 2020-11-26 DIAGNOSIS — H52223 Regular astigmatism, bilateral: Secondary | ICD-10-CM | POA: Diagnosis not present

## 2020-12-10 DIAGNOSIS — K219 Gastro-esophageal reflux disease without esophagitis: Secondary | ICD-10-CM | POA: Diagnosis not present

## 2020-12-10 DIAGNOSIS — J0100 Acute maxillary sinusitis, unspecified: Secondary | ICD-10-CM | POA: Diagnosis not present

## 2020-12-11 ENCOUNTER — Other Ambulatory Visit (HOSPITAL_COMMUNITY): Payer: Self-pay

## 2020-12-11 MED ORDER — OMEPRAZOLE 40 MG PO CPDR
40.0000 mg | DELAYED_RELEASE_CAPSULE | Freq: Every day | ORAL | 0 refills | Status: DC
  Filled 2020-12-11: qty 30, 30d supply, fill #0

## 2020-12-22 ENCOUNTER — Ambulatory Visit (HOSPITAL_COMMUNITY)
Admission: RE | Admit: 2020-12-22 | Discharge: 2020-12-22 | Disposition: A | Discharge status: Home / Self Care | Payer: 59 | Source: Ambulatory Visit | Attending: Family Medicine | Admitting: Family Medicine

## 2020-12-22 ENCOUNTER — Other Ambulatory Visit (HOSPITAL_COMMUNITY): Payer: Self-pay | Admitting: Family Medicine

## 2020-12-22 ENCOUNTER — Other Ambulatory Visit: Payer: Self-pay

## 2020-12-22 DIAGNOSIS — R042 Hemoptysis: Secondary | ICD-10-CM

## 2021-01-07 DIAGNOSIS — E782 Mixed hyperlipidemia: Secondary | ICD-10-CM | POA: Diagnosis not present

## 2021-01-07 DIAGNOSIS — R7301 Impaired fasting glucose: Secondary | ICD-10-CM | POA: Diagnosis not present

## 2021-01-11 DIAGNOSIS — R7301 Impaired fasting glucose: Secondary | ICD-10-CM | POA: Diagnosis not present

## 2021-01-11 DIAGNOSIS — K219 Gastro-esophageal reflux disease without esophagitis: Secondary | ICD-10-CM | POA: Diagnosis not present

## 2021-01-11 DIAGNOSIS — Z0001 Encounter for general adult medical examination with abnormal findings: Secondary | ICD-10-CM | POA: Diagnosis not present

## 2021-01-11 DIAGNOSIS — E782 Mixed hyperlipidemia: Secondary | ICD-10-CM | POA: Diagnosis not present

## 2021-01-11 DIAGNOSIS — R109 Unspecified abdominal pain: Secondary | ICD-10-CM | POA: Diagnosis not present

## 2021-01-25 ENCOUNTER — Other Ambulatory Visit (HOSPITAL_COMMUNITY): Payer: Self-pay

## 2021-01-26 ENCOUNTER — Other Ambulatory Visit (HOSPITAL_COMMUNITY): Payer: Self-pay

## 2021-01-26 MED ORDER — OMEPRAZOLE 40 MG PO CPDR
40.0000 mg | DELAYED_RELEASE_CAPSULE | Freq: Every day | ORAL | 3 refills | Status: AC
  Filled 2021-01-26: qty 30, 30d supply, fill #0
  Filled 2021-03-16: qty 30, 30d supply, fill #1
  Filled 2021-05-06: qty 30, 30d supply, fill #2
  Filled 2021-07-05: qty 30, 30d supply, fill #3

## 2021-01-27 ENCOUNTER — Encounter (HOSPITAL_COMMUNITY): Payer: Self-pay | Admitting: *Deleted

## 2021-01-27 ENCOUNTER — Emergency Department (HOSPITAL_COMMUNITY)
Admission: EM | Admit: 2021-01-27 | Discharge: 2021-01-27 | Disposition: A | Discharge status: Home / Self Care | Payer: 59 | Attending: Emergency Medicine | Admitting: Emergency Medicine

## 2021-01-27 ENCOUNTER — Emergency Department (HOSPITAL_COMMUNITY): Payer: 59

## 2021-01-27 ENCOUNTER — Other Ambulatory Visit: Payer: Self-pay

## 2021-01-27 DIAGNOSIS — R079 Chest pain, unspecified: Secondary | ICD-10-CM | POA: Diagnosis not present

## 2021-01-27 DIAGNOSIS — R0602 Shortness of breath: Secondary | ICD-10-CM | POA: Diagnosis not present

## 2021-01-27 DIAGNOSIS — R0789 Other chest pain: Secondary | ICD-10-CM | POA: Diagnosis not present

## 2021-01-27 DIAGNOSIS — Z79899 Other long term (current) drug therapy: Secondary | ICD-10-CM | POA: Insufficient documentation

## 2021-01-27 DIAGNOSIS — R110 Nausea: Secondary | ICD-10-CM | POA: Insufficient documentation

## 2021-01-27 LAB — BASIC METABOLIC PANEL
Anion gap: 7 (ref 5–15)
BUN: 14 mg/dL (ref 6–20)
CO2: 24 mmol/L (ref 22–32)
Calcium: 9.5 mg/dL (ref 8.9–10.3)
Chloride: 104 mmol/L (ref 98–111)
Creatinine, Ser: 0.65 mg/dL (ref 0.44–1.00)
GFR, Estimated: >60 mL/min (ref >60)
Glucose, Bld: 104 mg/dL — ABNORMAL HIGH (ref 70–99)
Potassium: 3.8 mmol/L (ref 3.5–5.1)
Sodium: 135 mmol/L (ref 135–145)

## 2021-01-27 LAB — CBC
HCT: 41.7 % (ref 36.0–46.0)
Hemoglobin: 14.3 g/dL (ref 12.0–15.0)
MCH: 31.4 pg (ref 26.0–34.0)
MCHC: 34.3 g/dL (ref 30.0–36.0)
MCV: 91.6 fL (ref 80.0–100.0)
Platelets: 281 10*3/uL (ref 150–400)
RBC: 4.55 MIL/uL (ref 3.87–5.11)
RDW: 12.2 % (ref 11.5–15.5)
WBC: 9 10*3/uL (ref 4.0–10.5)
nRBC: 0 % (ref 0.0–0.2)

## 2021-01-27 LAB — TROPONIN I (HIGH SENSITIVITY)
Troponin I (High Sensitivity): <2 ng/L (ref <18)
Troponin I (High Sensitivity): <2 ng/L (ref <18)

## 2021-01-27 LAB — MAGNESIUM: Magnesium: 2.2 mg/dL (ref 1.7–2.4)

## 2021-01-27 LAB — D-DIMER, QUANTITATIVE: D-Dimer, Quant: 0.35 ug/mL-FEU (ref 0.00–0.50)

## 2021-01-27 NOTE — ED Provider Notes (Signed)
[facility] EMERGENCY DEPARTMENT Provider Note   CSN: [id number] Arrival date & time: 01/27/21  1055     History  Chief Complaint  Patient presents with   Chest Pain    Marissa Trevino is a 46 y.o. female.   Chest Pain Associated symptoms: nausea   Associated symptoms: no abdominal pain, no dizziness, no fever, no headache, no numbness, no shortness of breath and no vomiting        Marissa Trevino is a 46 y.o. female who presents to the Emergency Department complaining of midsternal chest pain that radiates under her left breast and down her left arm to the level of her hand.  Symptoms have been waxing and waning for several days.  Episodes last several minutes.  When her symptoms occur, she states that she feels full in her chest and has some mild shortness of breath.  Denies any symptoms at present.  Notices some increased pain with certain movements of her chest.  Pain is not reproduced with arm movement.  She denies any shortness of breath at rest.  No history of PE or DVT.  She is a non-smoker.  She denies cough, fever, chills, or recent illness.   Home Medications Prior to Admission medications   Medication Sig Start Date End Date Taking? Authorizing Provider  BIOTIN PO Take 1 capsule by mouth daily as needed.    [provider]  cetirizine (ZYRTEC) 10 MG tablet Take 10 mg by mouth daily as needed for allergies.    [provider]  ELMIRON 100 MG capsule Take 1 capsule by mouth in the morning and 2 capsules in the evening. 08/24/20   Florian Buff, MD  omeprazole (PRILOSEC) 40 MG capsule Take by mouth daily. 02/20/17   [provider]  omeprazole (PRILOSEC) 40 MG capsule TAKE 1 CAPSULE BY MOUTH ONCE DAILY FOR REFLUX. 04/29/19 04/28/20  Celene Squibb, MD  omeprazole (PRILOSEC) 40 MG capsule Take 1 capsule (40 mg total) by mouth daily. 01/25/21     potassium chloride SA (K-DUR,KLOR-CON) 20 MEQ tablet TAKE 1 TABLET BY MOUTH DAILY. 04/03/18   Herminio Commons,  MD  valACYclovir (VALTREX) 1000 MG tablet TAKE 1 TABLET BY MOUTH DAILY 02/28/20 02/27/21  Derrek Monaco A, NP  escitalopram (LEXAPRO) 10 MG tablet Take 10 mg by mouth daily. Patient not taking: Reported on 07/16/2019 03/24/17 09/06/19  [provider]  mirabegron ER (MYRBETRIQ) 50 MG TB24 tablet Take 50 mg by mouth daily as needed (for urinary frequency). Patient not taking: Reported on 07/16/2019  09/06/19  [provider]    Allergies    Codeine    Review of Systems   Review of Systems  Constitutional:  Negative for fever.  Respiratory:  Negative for shortness of breath.   Cardiovascular:  Positive for chest pain.  Gastrointestinal:  Positive for nausea. Negative for abdominal pain and vomiting.  Neurological:  Negative for dizziness, numbness and headaches.  All other systems reviewed and are negative.  Physical Exam Updated Vital Signs BP (!) 160/101 (BP Location: Right Arm)    Pulse 88    Temp 97.7 F (36.5 C) (Oral)    Resp (!) 21    Ht 5\' 3"  (1.6 m)    Wt 89.8 kg    LMP 01/13/2021    SpO2 100%    BMI 35.07 kg/m  Physical Exam Vitals and nursing note reviewed.  Constitutional:      Appearance: Normal appearance. She is not ill-appearing or  toxic-appearing.  HENT:     Head: Normocephalic.  Eyes:     Conjunctiva/sclera: Conjunctivae normal.     Pupils: Pupils are equal, round, and reactive to light.  Cardiovascular:     Rate and Rhythm: Normal rate and regular rhythm.     Pulses: Normal pulses.  Pulmonary:     Effort: Pulmonary effort is normal.     Breath sounds: Normal breath sounds. No wheezing.  Chest:     Chest wall: No tenderness.  Abdominal:     Palpations: Abdomen is soft.     Tenderness: There is no abdominal tenderness. There is no guarding or rebound.  Musculoskeletal:        General: Normal range of motion.     Cervical back: Normal range of motion.  Skin:    General: Skin is warm.     Capillary Refill: Capillary refill takes less than 2  seconds.     Findings: No rash.  Neurological:     Mental Status: She is alert.     Sensory: No sensory deficit.     Motor: No weakness.    ED Results / Procedures / Treatments   Labs (all labs ordered are listed, but only abnormal results are displayed) Labs Reviewed  BASIC METABOLIC PANEL - Abnormal; Notable for the following components:      Result Value   Glucose, Bld 104 (*)    All other components within normal limits  CBC  D-DIMER, QUANTITATIVE  MAGNESIUM  TROPONIN I (HIGH SENSITIVITY)  TROPONIN I (HIGH SENSITIVITY)    EKG EKG Interpretation  Date/Time:  Wednesday January 27 2021 11:03:46 EST Ventricular Rate:  79 PR Interval:  160 QRS Duration: 100 QT Interval:  378 QTC Calculation: 433 R Axis:   29 Text Interpretation: Normal sinus rhythm Incomplete right bundle branch block Borderline ECG When compared with ECG of 06-Sep-2019 11:12, PREVIOUS ECG IS PRESENT Since last tracing rate slower Confirmed by Isla Pence [id number]) on 01/27/2021 11:08:06 AM  Radiology DG Chest 2 View  Result Date: 01/27/2021 CLINICAL DATA:  Chest pain and shortness of breath for the past 4-5 days. EXAM: CHEST - 2 VIEW COMPARISON:  Chest x-ray dated December 22, 2020. FINDINGS: The heart size and mediastinal contours are within normal limits. Both lungs are clear. The visualized skeletal structures are unremarkable. IMPRESSION: No active cardiopulmonary disease. Electronically Signed   By: Titus Dubin M.D.   On: 01/27/2021 11:31    Procedures Procedures    Medications Ordered in ED Medications - No data to display  ED Course/ Medical Decision Making/ A&P                           Medical Decision Making  Patient here for evaluation of midsternal chest pain that radiates under her left breast and down right arm.  Pain is associated with some fullness or tightness of her chest with shortness of breath when pain occurs.  Pain is intermittent lasting only several minutes.  No recent  injury or history of heart disease.  No illicit drug use.  On exam, patient does not appear in respiratory distress, she does not appear critically ill.  Vital signs are reassuring.  She is PERC negative.  Differential would include ACS, PE, viral or bacterial process, musculoskeletal injury  Work-up today includes labs, chest x-ray and EKG.    Labs interpreted by me without evidence of leukocytosis or anemia.  No electrolyte derangement.  D-dimer reassuring.  Troponin unremarkable.  EKG shows incomplete right bundle branch block with normal sinus rhythm.  Because of patient's intermittent chest pain unclear.  Overall, work-up reassuring.  Doubt emergent process.  Do feel that she will need further evaluation with cardiology.  She is agreeable to this plan.  I feel that she is appropriate for discharge home at this time.  She will follow-up closely with cards, referral information provided.  Strict return precautions also discussed.        Final Clinical Impression(s) / ED Diagnoses Final diagnoses:  Atypical chest pain    Rx / DC Orders ED Discharge Orders     None         Bufford Lope 01/29/21 1548    Isla Pence, MD 01/29/21 1824

## 2021-01-27 NOTE — ED Triage Notes (Signed)
Pt c/o mid chest pain that radiates under left breast and down left arm; pt states she feels "full" in her chest; sob

## 2021-01-27 NOTE — Discharge Instructions (Signed)
Call Dr. Nelly Laurence office to arrange a follow-up appointment regarding your chest pain.  Continue taking your omeprazole as directed.  Return to the emergency department for any new or worsening symptoms.

## 2021-02-14 MED FILL — Valacyclovir HCl Tab 1 GM: ORAL | 90 days supply | Qty: 90 | Fill #0 | Status: AC

## 2021-02-15 ENCOUNTER — Other Ambulatory Visit (HOSPITAL_COMMUNITY): Payer: Self-pay

## 2021-03-17 ENCOUNTER — Other Ambulatory Visit (HOSPITAL_COMMUNITY): Payer: Self-pay

## 2021-04-19 DIAGNOSIS — J069 Acute upper respiratory infection, unspecified: Secondary | ICD-10-CM | POA: Diagnosis not present

## 2021-05-06 ENCOUNTER — Other Ambulatory Visit (HOSPITAL_COMMUNITY): Payer: Self-pay

## 2021-07-06 ENCOUNTER — Other Ambulatory Visit (HOSPITAL_COMMUNITY): Payer: Self-pay

## 2021-07-07 DIAGNOSIS — E782 Mixed hyperlipidemia: Secondary | ICD-10-CM | POA: Diagnosis not present

## 2021-07-07 DIAGNOSIS — R7301 Impaired fasting glucose: Secondary | ICD-10-CM | POA: Diagnosis not present

## 2021-07-13 DIAGNOSIS — R030 Elevated blood-pressure reading, without diagnosis of hypertension: Secondary | ICD-10-CM | POA: Diagnosis not present

## 2021-07-13 DIAGNOSIS — Z6834 Body mass index (BMI) 34.0-34.9, adult: Secondary | ICD-10-CM | POA: Diagnosis not present

## 2021-07-13 DIAGNOSIS — K219 Gastro-esophageal reflux disease without esophagitis: Secondary | ICD-10-CM | POA: Diagnosis not present

## 2021-07-13 DIAGNOSIS — R7303 Prediabetes: Secondary | ICD-10-CM | POA: Diagnosis not present

## 2021-07-13 DIAGNOSIS — N3010 Interstitial cystitis (chronic) without hematuria: Secondary | ICD-10-CM | POA: Diagnosis not present

## 2021-07-13 DIAGNOSIS — N926 Irregular menstruation, unspecified: Secondary | ICD-10-CM | POA: Diagnosis not present

## 2021-07-13 DIAGNOSIS — E669 Obesity, unspecified: Secondary | ICD-10-CM | POA: Diagnosis not present

## 2021-07-13 DIAGNOSIS — E782 Mixed hyperlipidemia: Secondary | ICD-10-CM | POA: Diagnosis not present

## 2021-10-13 ENCOUNTER — Other Ambulatory Visit: Payer: Self-pay | Admitting: Obstetrics & Gynecology

## 2021-10-13 ENCOUNTER — Other Ambulatory Visit (HOSPITAL_COMMUNITY): Payer: Self-pay

## 2021-10-13 MED ORDER — OMEPRAZOLE 40 MG PO CPDR
40.0000 mg | DELAYED_RELEASE_CAPSULE | Freq: Every day | ORAL | 2 refills | Status: AC
  Filled 2021-10-13: qty 90, 90d supply, fill #0

## 2021-10-14 ENCOUNTER — Other Ambulatory Visit (HOSPITAL_COMMUNITY): Payer: Self-pay

## 2021-10-14 MED ORDER — ELMIRON 100 MG PO CAPS
ORAL_CAPSULE | ORAL | 11 refills | Status: AC
  Filled 2021-10-14: qty 90, 30d supply, fill #0

## 2021-11-08 ENCOUNTER — Other Ambulatory Visit (HOSPITAL_COMMUNITY): Payer: Self-pay | Admitting: Adult Health

## 2021-11-08 DIAGNOSIS — N644 Mastodynia: Secondary | ICD-10-CM

## 2021-11-08 DIAGNOSIS — Z1231 Encounter for screening mammogram for malignant neoplasm of breast: Secondary | ICD-10-CM

## 2021-11-18 ENCOUNTER — Ambulatory Visit (HOSPITAL_COMMUNITY)
Admission: RE | Admit: 2021-11-18 | Discharge: 2021-11-18 | Disposition: A | Discharge status: Home / Self Care | Payer: 59 | Source: Ambulatory Visit | Attending: Adult Health | Admitting: Adult Health

## 2021-11-18 DIAGNOSIS — N644 Mastodynia: Secondary | ICD-10-CM

## 2022-03-25 ENCOUNTER — Other Ambulatory Visit (HOSPITAL_COMMUNITY): Payer: Self-pay

## 2022-05-10 ENCOUNTER — Other Ambulatory Visit: Payer: Self-pay | Admitting: *Deleted

## 2022-05-10 MED ORDER — VALACYCLOVIR HCL 1 G PO TABS: 1000.0000 mg | ORAL_TABLET | Freq: Every day | ORAL | 3 refills | Status: DC

## 2022-06-30 ENCOUNTER — Other Ambulatory Visit (HOSPITAL_COMMUNITY): Payer: Self-pay | Admitting: Family Medicine

## 2022-06-30 ENCOUNTER — Ambulatory Visit (HOSPITAL_COMMUNITY)
Admission: RE | Admit: 2022-06-30 | Discharge: 2022-06-30 | Disposition: A | Discharge status: Home / Self Care | Payer: BC Managed Care – PPO | Source: Ambulatory Visit | Attending: Family Medicine | Admitting: Family Medicine

## 2022-06-30 DIAGNOSIS — J069 Acute upper respiratory infection, unspecified: Secondary | ICD-10-CM | POA: Insufficient documentation

## 2022-06-30 DIAGNOSIS — R0602 Shortness of breath: Secondary | ICD-10-CM | POA: Diagnosis not present

## 2022-06-30 DIAGNOSIS — R059 Cough, unspecified: Secondary | ICD-10-CM | POA: Diagnosis not present

## 2022-06-30 DIAGNOSIS — R079 Chest pain, unspecified: Secondary | ICD-10-CM | POA: Diagnosis not present

## 2023-05-30 ENCOUNTER — Other Ambulatory Visit (HOSPITAL_COMMUNITY): Payer: Self-pay | Admitting: Nurse Practitioner

## 2023-05-30 DIAGNOSIS — Z1231 Encounter for screening mammogram for malignant neoplasm of breast: Secondary | ICD-10-CM

## 2023-05-31 ENCOUNTER — Other Ambulatory Visit: Payer: Self-pay | Admitting: Adult Health

## 2023-05-31 ENCOUNTER — Encounter: Payer: Self-pay | Admitting: *Deleted

## 2023-06-21 ENCOUNTER — Ambulatory Visit (HOSPITAL_COMMUNITY)
Admission: RE | Admit: 2023-06-21 | Discharge: 2023-06-21 | Disposition: A | Discharge status: Home / Self Care | Source: Ambulatory Visit | Attending: Nurse Practitioner | Admitting: Nurse Practitioner

## 2023-06-21 DIAGNOSIS — Z1231 Encounter for screening mammogram for malignant neoplasm of breast: Secondary | ICD-10-CM | POA: Insufficient documentation

## 2023-06-26 ENCOUNTER — Ambulatory Visit: Payer: Self-pay | Admitting: Adult Health

## 2023-06-27 ENCOUNTER — Other Ambulatory Visit (HOSPITAL_COMMUNITY): Payer: Self-pay | Admitting: Nurse Practitioner

## 2023-06-27 DIAGNOSIS — R928 Other abnormal and inconclusive findings on diagnostic imaging of breast: Secondary | ICD-10-CM

## 2023-09-12 ENCOUNTER — Encounter (INDEPENDENT_AMBULATORY_CARE_PROVIDER_SITE_OTHER): Payer: Self-pay | Admitting: *Deleted
